# Patient Record
Sex: Female | Born: 1985 | Race: Black or African American | Hispanic: No | Marital: Married | Smoking: Never smoker
Health system: Southern US, Community
[De-identification: ages and names within clinical notes are randomized; demographics above are authoritative.]

## PROBLEM LIST (undated history)

## (undated) DIAGNOSIS — E669 Obesity, unspecified: Secondary | ICD-10-CM

## (undated) HISTORY — DX: Obesity, unspecified: E66.9

## (undated) HISTORY — PX: OTHER SURGICAL HISTORY: SHX169

---

## 2004-07-03 ENCOUNTER — Other Ambulatory Visit: Admission: RE | Admit: 2004-07-03 | Discharge: 2004-07-03 | Payer: Self-pay | Admitting: Obstetrics and Gynecology

## 2004-10-22 ENCOUNTER — Emergency Department (HOSPITAL_COMMUNITY): Admission: EM | Admit: 2004-10-22 | Discharge: 2004-10-22 | Payer: Self-pay | Admitting: Emergency Medicine

## 2005-02-16 ENCOUNTER — Inpatient Hospital Stay (HOSPITAL_COMMUNITY): Admission: AD | Admit: 2005-02-16 | Discharge: 2005-02-16 | Payer: Self-pay | Admitting: *Deleted

## 2006-04-06 ENCOUNTER — Other Ambulatory Visit: Admission: RE | Admit: 2006-04-06 | Discharge: 2006-04-06 | Payer: Self-pay | Admitting: Obstetrics and Gynecology

## 2007-03-11 ENCOUNTER — Other Ambulatory Visit: Admission: RE | Admit: 2007-03-11 | Discharge: 2007-03-11 | Payer: Self-pay | Admitting: Obstetrics and Gynecology

## 2007-05-13 ENCOUNTER — Inpatient Hospital Stay (HOSPITAL_COMMUNITY): Admission: AD | Admit: 2007-05-13 | Discharge: 2007-05-14 | Payer: Self-pay | Admitting: Obstetrics and Gynecology

## 2007-08-18 ENCOUNTER — Ambulatory Visit (HOSPITAL_COMMUNITY): Admission: RE | Admit: 2007-08-18 | Discharge: 2007-08-18 | Payer: Self-pay | Admitting: Obstetrics and Gynecology

## 2007-08-26 ENCOUNTER — Ambulatory Visit (HOSPITAL_COMMUNITY): Admission: RE | Admit: 2007-08-26 | Discharge: 2007-08-26 | Payer: Self-pay | Admitting: Obstetrics and Gynecology

## 2008-01-11 ENCOUNTER — Encounter (INDEPENDENT_AMBULATORY_CARE_PROVIDER_SITE_OTHER): Payer: Self-pay | Admitting: Obstetrics and Gynecology

## 2008-01-11 ENCOUNTER — Inpatient Hospital Stay (HOSPITAL_COMMUNITY): Admission: AD | Admit: 2008-01-11 | Discharge: 2008-01-13 | Payer: Self-pay | Admitting: Obstetrics & Gynecology

## 2009-04-10 ENCOUNTER — Encounter (INDEPENDENT_AMBULATORY_CARE_PROVIDER_SITE_OTHER): Payer: Self-pay | Admitting: Obstetrics and Gynecology

## 2009-04-10 ENCOUNTER — Inpatient Hospital Stay (HOSPITAL_COMMUNITY): Admission: AD | Admit: 2009-04-10 | Discharge: 2009-04-13 | Payer: Self-pay | Admitting: Obstetrics and Gynecology

## 2011-03-25 LAB — CBC
HCT: 30.8 % — ABNORMAL LOW (ref 36.0–46.0)
HCT: 37.4 % (ref 36.0–46.0)
Hemoglobin: 10.3 g/dL — ABNORMAL LOW (ref 12.0–15.0)
MCHC: 33.6 g/dL (ref 30.0–36.0)
MCV: 84.3 fL (ref 78.0–100.0)
RBC: 3.61 MIL/uL — ABNORMAL LOW (ref 3.87–5.11)
RBC: 4.43 MIL/uL (ref 3.87–5.11)
RDW: 15.1 % (ref 11.5–15.5)
WBC: 7.9 10*3/uL (ref 4.0–10.5)

## 2011-03-25 LAB — RPR: RPR Ser Ql: NONREACTIVE

## 2011-04-28 NOTE — Discharge Summary (Signed)
[name], Frances Murray             ACCOUNT NO.:  [id number]   MEDICAL RECORD NO.:  [id number]          PATIENT TYPE:  INP   LOCATION:  9132                          FACILITY:  [facility]   PHYSICIAN:  Gerrit Friends. Aldona Bar, M.D.   DATE OF BIRTH:  27-Oct-1986   DATE OF ADMISSION:  04/10/2009  DATE OF DISCHARGE:                               DISCHARGE SUMMARY   DISCHARGE DIAGNOSES:  1. Term pregnancy delivered 7 pounds 7 ounces female infant, Apgars 7      and 9.  2. Blood type O+.  3. Failure to progress in labor.  4. Meconium-stained fluid.  5. Occiput posterior presentation.   PROCEDURES:  Primary low-transverse cesarean section.   SUMMARY:  This 25 year old gravida 2, para 1 was admitted at almost 41  weeks in labor.  She progressed to about 8 cm of dilatation, but  arrested and required a cesarean section for delivery and was found to  have an occiput posterior presentation.  She was delivered by Dr. Edward Jolly  with 7 pounds 7 ounces female infant with Apgars of 7 and 9.  Her  postpartum/postoperative course was benign.  Her discharge hemoglobin  was 10.3 with a white count of 10,300and platelet count of 141,000.  On  the morning of May 1, she was ambulating well, tolerating a regular diet  well, having normal bowel and bladder function was afebrile.  The wound  was clean and dry and she was anticipating discharge.  Accordingly, she  was given all appropriate instructions for discharge brochure and  understood all instructions well.   Discharge medications include vitamins - one a day as long she is breast-  feeding, Feosol capsules - one four times a week, and she was given  prescriptions for Motrin 600 mg to use every 6 hours as needed for  cramping or pain, and Tylox 1-2 every 4-6 hours as needed for more  severe pain.  She will return to the office in followup in approximately  4 weeks' time or as needed.  Staples were removed and wound was Steri-  Stripped on the morning of discharge.   CONDITION ON DISCHARGE:  Improved.      Gerrit Friends. Aldona Bar, M.D.  Electronically Signed     RMW/MEDQ  D:  04/13/2009  T:  04/13/2009  Job:  [id number]

## 2011-04-28 NOTE — Op Note (Signed)
NAMESCARLETT, PORTLOCK             ACCOUNT NO.:  [id number]   MEDICAL RECORD NO.:  [id number]          PATIENT TYPE:  INP   LOCATION:  9132                          FACILITY:  [facility]   PHYSICIAN:  Randye Lobo, M.D.   DATE OF BIRTH:  10/05/1986   DATE OF PROCEDURE:  04/10/2009  DATE OF DISCHARGE:                               OPERATIVE REPORT   PREOPERATIVE DIAGNOSES:  1. Intrauterine gestation at 40 plus 6 weeks.  2. Failure to progress.   POSTOPERATIVE DIAGNOSES:  1. Intrauterine gestation at 40 plus 6 weeks.  2. Failure to progress.  3. Occiput posterior position.  4. Thick meconium-stained amniotic fluid.   PROCEDURE:  Primary low segment transverse cesarean section.   SURGEON:  Randye Lobo, MD   ANESTHESIA:  Epidural.   INTRAVENOUS FLUIDS:  1900 mL of Ringer's lactate.   ESTIMATED BLOOD LOSS:  900 mL.   URINE OUTPUT:  250 mL.   COMPLICATIONS:  None.   INDICATIONS FOR PROCEDURE:  The patient is a 25 year old gravida 2, para  1-0-0-1 African American female at 74 plus 6 weeks' gestation who  presented early in the morning of April 10, 2009, reporting  contractions.  Upon admission, the patient's cervix was 6 cm dilated.  The patient had been scheduled for a postdates injection later that  evening.  The patient did receive an epidural for anesthesia.  She had  artificial rupture of membranes when she was 8 cm dilated with a  presence of what appeared to be meconium, although it was mixed with  bloody show.  The patient required Pitocin augmentation of her labor.  Her cervix, however, never advanced beyond 8 cm of dilation.  The  Pitocin had to be discontinued on various occasions due to fetal  decelerations.  Overall, the tracing looked reactive and reassuring when  Pitocin was discontinued.  The vertex was suspected to be occiput  posterior and a plan was made to proceed with a cesarean section after  risks, benefits, and alternatives were reviewed.   FINDINGS:   A viable female was delivered at 1432 in the occiput  posterior position with Apgars of 7 at 1 and 9 at 5 minutes.  There was  a nuchal cord x1.  The weight was 7 pounds 7 ounces.  There was thick  meconium-stained amniotic fluid.  The placenta had a evidence of  meconium staining.  The fallopian tubes, uterus, and ovaries appeared to  be normal.   SPECIMEN:  The placenta was sent to pathology.   PROCEDURE:  The patient was escorted from her Labor and Delivery Suite  down to the operating room.  She did receive Ancef 1 g IV for antibiotic  prophylaxis.   In the operating room, her epidural was dosed for surgical anesthesia.  The patient was then placed in the supine position with a left lateral  tilt.  A Foley catheter had been previously placed.  The abdomen was  sterilely prepped and draped.   A Pfannenstiel incision was created sharply with a scalpel.  The  incision was carried down to the fascia using the scalpel  and monopolar  cautery for hemostasis.  The fascia was then incised in the midline and  the incision was extended bilaterally.  The rectus muscles were  dissected off the fascia superiorly and inferiorly.  The rectus muscles  were then sharply divided in the midline.  The parietal perineum was  elevated with the hemostat clamps and entered sharply.  The peritoneal  incision was extended cranially and caudally.   The lower uterine segment was exposed with the bladder retractor.  The  bladder flap was sharply created.  A transverse lower uterine segment  incision was then created sharply with a scalpel.  The incision was  extended bilaterally in an upward fashion with a bandage scissors.  The  hand was inserted through the uterine incision.  There was immediately  cord visible anterior to the vertex.  The vertex was delivered without  difficulty and the nuchal cord was then reduced.  The remainder of the  infant was delivered without difficulty.  The nares and the mouth  were  suctioned with the DeLee upon delivery of the vertex.  The newborn did  have a spontaneous cry and was also then suctioned with a bulb suction.   The newborn was carried over to the awaiting pediatricians in vigorous  condition.   The placenta was then manually extracted and was sent to pathology.   The bladder retractor was then replaced inside the peritoneal cavity.  The uterus was wiped clean with a moistened lap pad.  The uterine  incision was closed with double layer closure of #1 chromic.  The first  was a running locked layer and the second was an imbricating layer.  There was some additional bleeding along the left apical portion of the  incision and a hand was placed behind the broad ligament and figure-of-  eight sutures of #1 chromic were then placed in the apical portion of  the incision to create good hemostasis.  This was successful.   The peritoneal cavity was then irrigated and suctioned and good  hemostasis was confirmed and the abdomen was therefore closed.  The  parietal peritoneum was closed with a running suture of 3-0 Vicryl.  The  rectus muscles were reapproximated in the midline with interrupted  sutures of #1 chromic.  The fascia was closed with a running suture of 0  Vicryl.  The subcutaneous layer was irrigated, suctioned, and made  hemostatic with monopolar cautery.  The skin was closed with staples and  a sterile bandage was placed over this.   This concluded the patient's procedure.  There were no complications.  All needle, instrument, and sponge counts were correct.      Randye Lobo, M.D.  Electronically Signed     BES/MEDQ  D:  04/10/2009  T:  04/11/2009  Job:  [id number]

## 2011-07-28 ENCOUNTER — Other Ambulatory Visit: Payer: Self-pay | Admitting: Obstetrics and Gynecology

## 2011-09-03 LAB — CBC
HCT: 29.7 — ABNORMAL LOW
Hemoglobin: 10.2 — ABNORMAL LOW
MCV: 82.5
Platelets: 167
RBC: 3.59 — ABNORMAL LOW
RBC: 4.4
RDW: 14.8
WBC: 9.4

## 2011-09-03 LAB — WET PREP, GENITAL
Trich, Wet Prep: NONE SEEN
Yeast Wet Prep HPF POC: NONE SEEN

## 2011-09-03 LAB — RPR: RPR Ser Ql: NONREACTIVE

## 2012-10-08 ENCOUNTER — Emergency Department (HOSPITAL_COMMUNITY)
Admission: EM | Admit: 2012-10-08 | Discharge: 2012-10-09 | Disposition: A | Discharge status: Home / Self Care | Payer: BC Managed Care – PPO | Attending: Emergency Medicine | Admitting: Emergency Medicine

## 2012-10-08 ENCOUNTER — Encounter (HOSPITAL_COMMUNITY): Payer: Self-pay | Admitting: *Deleted

## 2012-10-08 DIAGNOSIS — Z792 Long term (current) use of antibiotics: Secondary | ICD-10-CM | POA: Insufficient documentation

## 2012-10-08 DIAGNOSIS — M545 Low back pain, unspecified: Secondary | ICD-10-CM | POA: Insufficient documentation

## 2012-10-08 DIAGNOSIS — N12 Tubulo-interstitial nephritis, not specified as acute or chronic: Secondary | ICD-10-CM

## 2012-10-08 DIAGNOSIS — Z79899 Other long term (current) drug therapy: Secondary | ICD-10-CM | POA: Insufficient documentation

## 2012-10-08 DIAGNOSIS — R42 Dizziness and giddiness: Secondary | ICD-10-CM | POA: Insufficient documentation

## 2012-10-08 LAB — COMPREHENSIVE METABOLIC PANEL WITH GFR
ALT: 13 U/L (ref 0–35)
AST: 17 U/L (ref 0–37)
CO2: 25 meq/L (ref 19–32)
Calcium: 9.5 mg/dL (ref 8.4–10.5)
Chloride: 98 meq/L (ref 96–112)
GFR calc non Af Amer: >90 mL/min (ref >90)
Sodium: 134 meq/L — ABNORMAL LOW (ref 135–145)
Total Bilirubin: 0.5 mg/dL (ref 0.3–1.2)

## 2012-10-08 LAB — CBC WITH DIFFERENTIAL/PLATELET
Basophils Absolute: 0 K/uL (ref 0.0–0.1)
Basophils Relative: 0 % (ref 0–1)
Eosinophils Absolute: 0 10*3/uL (ref 0.0–0.7)
Eosinophils Relative: 0 % (ref 0–5)
HCT: 37.9 % (ref 36.0–46.0)
Hemoglobin: 12.7 g/dL (ref 12.0–15.0)
Lymphocytes Relative: 14 % (ref 12–46)
Lymphs Abs: 1.3 10*3/uL (ref 0.7–4.0)
MCH: 28.9 pg (ref 26.0–34.0)
MCHC: 33.5 g/dL (ref 30.0–36.0)
MCV: 86.3 fL (ref 78.0–100.0)
Monocytes Absolute: 0.6 10*3/uL (ref 0.1–1.0)
Monocytes Relative: 6 % (ref 3–12)
Neutro Abs: 7.5 K/uL (ref 1.7–7.7)
Neutrophils Relative %: 80 % — ABNORMAL HIGH (ref 43–77)
Platelets: 231 K/uL (ref 150–400)
RBC: 4.39 MIL/uL (ref 3.87–5.11)
RDW: 12.6 % (ref 11.5–15.5)
WBC: 9.5 K/uL (ref 4.0–10.5)

## 2012-10-08 LAB — URINALYSIS, ROUTINE W REFLEX MICROSCOPIC
Bilirubin Urine: NEGATIVE
Glucose, UA: NEGATIVE mg/dL
Ketones, ur: NEGATIVE mg/dL
Leukocytes, UA: NEGATIVE
Nitrite: POSITIVE — AB
Protein, ur: NEGATIVE mg/dL
Specific Gravity, Urine: 1.017 (ref 1.005–1.030)
Urobilinogen, UA: 0.2 mg/dL (ref 0.0–1.0)
pH: 6 (ref 5.0–8.0)

## 2012-10-08 LAB — COMPREHENSIVE METABOLIC PANEL
Albumin: 4 g/dL (ref 3.5–5.2)
Alkaline Phosphatase: 84 U/L (ref 39–117)
BUN: 6 mg/dL (ref 6–23)
Creatinine, Ser: 0.8 mg/dL (ref 0.50–1.10)
GFR calc Af Amer: >90 mL/min (ref >90)
Glucose, Bld: 87 mg/dL (ref 70–99)
Potassium: 3.2 mEq/L — ABNORMAL LOW (ref 3.5–5.1)
Total Protein: 8.4 g/dL — ABNORMAL HIGH (ref 6.0–8.3)

## 2012-10-08 LAB — URINE MICROSCOPIC-ADD ON

## 2012-10-08 LAB — POCT PREGNANCY, URINE: Preg Test, Ur: NEGATIVE

## 2012-10-08 MED ORDER — KETOROLAC TROMETHAMINE 30 MG/ML IJ SOLN
30.0000 mg | Freq: Once | INTRAMUSCULAR | Status: AC
  Administered 2012-10-08: 30 mg via INTRAVENOUS
  Filled 2012-10-08: qty 1

## 2012-10-08 MED ORDER — SODIUM CHLORIDE 0.9 % IV BOLUS (SEPSIS)
1000.0000 mL | Freq: Once | INTRAVENOUS | Status: AC
  Administered 2012-10-08: 1000 mL via INTRAVENOUS

## 2012-10-08 MED ORDER — MORPHINE SULFATE 4 MG/ML IJ SOLN
4.0000 mg | Freq: Once | INTRAMUSCULAR | Status: AC
  Administered 2012-10-08: 4 mg via INTRAVENOUS
  Filled 2012-10-08: qty 1

## 2012-10-08 MED ORDER — DEXTROSE 5 % IV SOLN
1.0000 g | Freq: Once | INTRAVENOUS | Status: AC
  Administered 2012-10-08: 1 g via INTRAVENOUS
  Filled 2012-10-08: qty 10

## 2012-10-08 MED ORDER — ACETAMINOPHEN 325 MG PO TABS
650.0000 mg | ORAL_TABLET | Freq: Once | ORAL | Status: AC
  Administered 2012-10-08: 650 mg via ORAL
  Filled 2012-10-08: qty 2

## 2012-10-08 NOTE — ED Notes (Signed)
Pt c/o bil flank pain since yesterday and fever and chills today.  Denies pelvic, abd pain, but states some decreased appetite.

## 2012-10-08 NOTE — ED Provider Notes (Signed)
History     CSN: [id number]  Arrival date & time 10/08/12  1859   First MD Initiated Contact with Patient 10/08/12 2305      Chief Complaint  Patient presents with  . Flank Pain  . Chills  . Dizziness    (Consider location/radiation/quality/duration/timing/severity/associated sxs/prior treatment) HPI Comments: Pt with no sig PMH reports yesterday had some intermittent left flank pain, today has gotten worse, more constant, now fevers, chills, associated with increased freq, no dysuria.  No sig abd pain.  Myalgias and now diffuse lower back pain.  No unusual GYN symptoms, LMP was normal.  Pt denies any prior surgeries.  Pt did not take anything in particular for symptoms.  Pt reports feeling generalized weak and lightheaded at times.  No CP, SOB, cough, congestion.    Patient is a 26 y.o. female presenting with flank pain. The history is provided by the patient and a relative.  Flank Pain Pertinent negatives include no abdominal pain.    History reviewed. No pertinent past medical history.  Past Surgical History  Procedure Date  . Cesarian     History reviewed. No pertinent family history.  History  Substance Use Topics  . Smoking status: Not on file  . Smokeless tobacco: Not on file  . Alcohol Use:     OB History    Grav Para Term Preterm Abortions TAB SAB Ect Mult Living                  Review of Systems  Constitutional: Positive for fever, chills and appetite change.  Gastrointestinal: Negative for nausea, vomiting, abdominal pain, diarrhea and constipation.  Genitourinary: Positive for frequency and flank pain. Negative for decreased urine volume, vaginal bleeding, vaginal discharge, vaginal pain and pelvic pain.  Musculoskeletal: Positive for back pain.  Skin: Negative for rash.  All other systems reviewed and are negative.    Allergies  Review of patient's allergies indicates no known allergies.  Home Medications   Current Outpatient Rx  Name Route  Sig Dispense Refill  . HYDROCODONE-ACETAMINOPHEN 5-325 MG PO TABS  1-2 tablets po q 6 hours prn moderate to severe pain 20 tablet 0  . LEVOFLOXACIN 500 MG PO TABS Oral Take 1 tablet (500 mg total) by mouth daily. 10 tablet 0  . ONDANSETRON 8 MG PO TBDP Oral Take 1 tablet (8 mg total) by mouth every 12 (twelve) hours as needed for nausea. 20 tablet 0    BP 118/58  Pulse 104  Temp 99.8 F (37.7 C) (Oral)  Resp 18  SpO2 97%  LMP 09/13/2012  Physical Exam  Nursing note and vitals reviewed. Constitutional: She is oriented to person, place, and time. She appears well-developed and well-nourished. No distress.       Pt with current rigors and chills  HENT:  Head: Normocephalic and atraumatic.  Eyes: Pupils are equal, round, and reactive to light. No scleral icterus.  Neck: Normal range of motion. Neck supple.  Cardiovascular: Normal rate.   No murmur heard. Pulmonary/Chest: Effort normal and breath sounds normal. No respiratory distress.  Abdominal: Soft. She exhibits no distension. There is tenderness. There is no rebound and no guarding.       Minimal left mid abd pain on palpation  Neurological: She is alert and oriented to person, place, and time.  Skin: Skin is warm and dry. She is not diaphoretic.  Psychiatric: She has a normal mood and affect.    ED Course  Procedures (including critical care time)  Labs Reviewed  CBC WITH DIFFERENTIAL - Abnormal; Notable for the following:    Neutrophils Relative 80 (*)     All other components within normal limits  COMPREHENSIVE METABOLIC PANEL - Abnormal; Notable for the following:    Sodium 134 (*)     Potassium 3.2 (*)     Total Protein 8.4 (*)     All other components within normal limits  URINALYSIS, ROUTINE W REFLEX MICROSCOPIC - Abnormal; Notable for the following:    APPearance CLOUDY (*)     Hgb urine dipstick SMALL (*)     Nitrite POSITIVE (*)     All other components within normal limits  URINE MICROSCOPIC-ADD ON -  Abnormal; Notable for the following:    Squamous Epithelial / LPF FEW (*)     Bacteria, UA MANY (*)     All other components within normal limits  POCT PREGNANCY, URINE  URINE CULTURE   No results found.   1. Pyelonephritis      ra sat is 100% and i interpret to be normal   1:11 AM Fever again improved, HR is down to near normal with toradol, pain control and IVF bolus.  Will give Rx for pain, nausea and levaquin for 10 days MDM  Symptoms suggestive of pylenoephritis.  No GYN symptoms.  Pt has GYN she can follow up with.  Will give dose of IV abx, culture sent.  + nitrites, bacteria with few squamous epithelials.  WBC is normal, but with left shift.  Discussed symptoms, return instructions, importance of follow up with PCP and expectations.  No N/V.  Will give more antipyretics, IVF's and ensure HR improves as well.  BP normal.          Gavin Pound. Oletta Lamas, MD 10/09/12 1610

## 2012-10-08 NOTE — ED Provider Notes (Signed)
MSE was initiated and I personally evaluated the patient and placed orders (if any) at  11:04 PM on October 08, 2012.  The patient appears stable so that the remainder of the MSE may be completed by another provider.  Ethelda Chick, MD 10/08/12 [phone]

## 2012-10-08 NOTE — ED Notes (Signed)
I gave the patient a warm blanket. 

## 2012-10-09 MED ORDER — LEVOFLOXACIN 500 MG PO TABS: 500.0000 mg | ORAL_TABLET | Freq: Every day | ORAL | Status: DC

## 2012-10-09 MED ORDER — HYDROCODONE-ACETAMINOPHEN 5-325 MG PO TABS: ORAL_TABLET | ORAL | Status: DC

## 2012-10-09 MED ORDER — ONDANSETRON 8 MG PO TBDP: 8.0000 mg | ORAL_TABLET | Freq: Two times a day (BID) | ORAL | Status: DC | PRN

## 2012-10-09 NOTE — Discharge Instructions (Signed)
 Pyelonephritis, Adult Pyelonephritis is a kidney infection. In general, there are 2 main types of pyelonephritis:  Infections that come on quickly without any warning (acute pyelonephritis).  Infections that persist for a long period of time (chronic pyelonephritis). CAUSES  Two main causes of pyelonephritis are:  Bacteria traveling from the bladder to the kidney. This is a problem especially in pregnant women. The urine in the bladder can become filled with bacteria from multiple causes, including:  Inflammation of the prostate gland (prostatitis).  Sexual intercourse in females.  Bladder infection (cystitis).  Bacteria traveling from the bloodstream to the tissue part of the kidney. Problems that may increase your risk of getting a kidney infection include:  Diabetes.  Kidney stones or bladder stones.  Cancer.  Catheters placed in the bladder.  Other abnormalities of the kidney or ureter. SYMPTOMS   Abdominal pain.  Pain in the side or flank area.  Fever.  Chills.  Upset stomach.  Blood in the urine (dark urine).  Frequent urination.  Strong or persistent urge to urinate.  Burning or stinging when urinating. DIAGNOSIS  Your caregiver may diagnose your kidney infection based on your symptoms. A urine sample may also be taken. TREATMENT  In general, treatment depends on how severe the infection is.   If the infection is mild and caught early, your caregiver may treat you with oral antibiotics and send you home.  If the infection is more severe, the bacteria may have gotten into the bloodstream. This will require intravenous (IV) antibiotics and a hospital stay. Symptoms may include:  High fever.  Severe flank pain.  Shaking chills.  Even after a hospital stay, your caregiver may require you to be on oral antibiotics for a period of time.  Other treatments may be required depending upon the cause of the infection. HOME CARE INSTRUCTIONS   Take your  antibiotics as directed. Finish them even if you start to feel better.  Make an appointment to have your urine checked to make sure the infection is gone.  Drink enough fluids to keep your urine clear or pale yellow.  Take medicines for the bladder if you have urgency and frequency of urination as directed by your caregiver. SEEK IMMEDIATE MEDICAL CARE IF:   You have a fever or persistent symptoms for more than 2-3 days.  You have a fever and your symptoms suddenly get worse.  You are unable to take your antibiotics or fluids.  You develop shaking chills.  You experience extreme weakness or fainting.  There is no improvement after 2 days of treatment. MAKE SURE YOU:  Understand these instructions.  Will watch your condition.  Will get help right away if you are not doing well or get worse. Document Released: 11/30/2005 Document Revised: 05/31/2012 Document Reviewed: 05/06/2011 [facility] Patient Information 2013 [place], [place].    Narcotic and benzodiazepine use may cause drowsiness, slowed breathing or dependence.  Please use with caution and do not drive, operate machinery or watch young children alone while taking them.  Taking combinations of these medications or drinking alcohol will potentiate these effects.     Take ibuprofen  for aches and fevers.  Norco contains tylenol  already.  Take antibiotics to completion.

## 2012-10-09 NOTE — ED Notes (Signed)
I gave the patient a cup of ice and a sprite. I gave her visitor the same.

## 2012-10-10 LAB — URINE CULTURE

## 2012-10-11 NOTE — ED Notes (Signed)
+   Urine Patient treated with Levaquin to same-chart appended per protocol MD.

## 2013-06-15 ENCOUNTER — Other Ambulatory Visit: Payer: Self-pay | Admitting: Obstetrics and Gynecology

## 2014-05-03 ENCOUNTER — Other Ambulatory Visit: Payer: Self-pay | Admitting: Family Medicine

## 2014-05-03 DIAGNOSIS — E010 Iodine-deficiency related diffuse (endemic) goiter: Secondary | ICD-10-CM

## 2014-05-08 ENCOUNTER — Ambulatory Visit
Admission: RE | Admit: 2014-05-08 | Discharge: 2014-05-08 | Disposition: A | Discharge status: Home / Self Care | Payer: BC Managed Care – PPO | Source: Ambulatory Visit | Attending: Family Medicine | Admitting: Family Medicine

## 2014-05-08 DIAGNOSIS — E010 Iodine-deficiency related diffuse (endemic) goiter: Secondary | ICD-10-CM

## 2014-06-21 ENCOUNTER — Encounter: Payer: Self-pay | Admitting: Dietician

## 2014-06-21 ENCOUNTER — Encounter: Payer: BC Managed Care – PPO | Attending: Family Medicine | Admitting: Dietician

## 2014-06-21 DIAGNOSIS — Z713 Dietary counseling and surveillance: Secondary | ICD-10-CM | POA: Diagnosis not present

## 2014-06-21 NOTE — Patient Instructions (Addendum)
-  Make a grocery list based on planned meals  -Avoid going to the store hungry   -Fill up on non-starchy vegetables (any veggie except corn, peas, and potatoes)  -Follow MyPlate recommendations  -Increase activity level  -Find a new exercise video on YouTube (goal: 2-3x a week)  -Consider getting a pedometer  -Zumba?  -Eat with the family at the table  -Practice eating mindfully and listen to hunger cues  -Avoid skipping meals  -Refer to snack list  -Have something every 4-5 hours   -Only have cobbler on peach day  -Avoid sweetened beverages  -Have Splenda in coffee  -Cut sweet tea with 1/2 unsweetened tea  -Think of strategies to manage stress that don't involve eating  -Exercise/stretching?

## 2014-06-21 NOTE — Progress Notes (Signed)
  Medical Nutrition Therapy:  Appt start time: 1000 end time:  1045.   Assessment:  Primary concerns today: Frances Murray is here today to discuss weight loss strategies. 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 Kanai lives with her husband and 2 young children. She states that she has always been overweight. Lowest weight: 160 lbs in high school. She and her husband share the grocery shopping responsibilities and she usually cooks. Eddy usually eats lunch in the cafeteria at work. The family goes out to eat on the weekends sometimes. At home, she eats in her bedroom with the TV on. She reports that she has a sweet tooth and struggles with cravings. She also states that she is a stress/emotional eater.  Preferred Learning Style:  No preference indicated   Learning readiness:  Contemplating  Ready   MEDICATIONS: multivitamin   DIETARY INTAKE:  Avoided foods include red meat.    24-hr recall:  B ( AM): sometimes skips; grits, toast, bacon, cheese at work OR pancakes, [place] sausage, eggs Snk ( AM): none  L ( PM): chicken, [place], or fish with vegetable, and dessert  Snk ( PM): none D ( PM): sometimes skips; more like a snack like chips and salsa Snk ( PM): sometimes; chips and dip, grapes  Beverages: mostly water, coffee with cream and sugar or Splenda, sweet tea, having less fruit juice  Usual physical activity: none; has a gym membership  Estimated energy needs: 2000 calories  Progress Towards Goal(s):  In progress.   Nutritional Diagnosis:  [place]-3.3 Overweight/obesity As related to excess energy intake, inappropriate food choices, and physical inactivity.  As evidenced by BMI 41.5.    Intervention:  Nutrition counseling provided.  -Make a grocery list based on planned meals  -Avoid going to the store hungry   -Fill up on non-starchy vegetables (any veggie except corn, peas, and potatoes)  -Follow MyPlate recommendations  -Increase activity  level  -Find a new exercise video on YouTube (goal: 2-3x a week)  -Consider getting a pedometer  -Zumba?  -Eat with the family at the table  -Practice eating mindfully and listen to hunger cues  -Avoid skipping meals  -Refer to snack list  -Have something every 4-5 hours   -Only have cobbler on peach day  -Avoid sweetened beverages  -Have Splenda in coffee  -Cut sweet tea with 1/2 unsweetened tea  -Think of strategies to manage stress that don't involve eating  -Exercise/stretching?  Teaching Method Utilized:  Visual Auditory  Handouts given during visit include:  MyPlate  15g CHO + protein snacks  Barriers to learning/adherence to lifestyle change: schedule  Demonstrated degree of understanding via:  Teach Back   Monitoring/Evaluation:  Dietary intake, exercise, and body weight in 2 month(s).

## 2014-08-23 ENCOUNTER — Ambulatory Visit: Payer: BC Managed Care – PPO | Admitting: Dietician

## 2014-10-25 ENCOUNTER — Other Ambulatory Visit: Payer: Self-pay | Admitting: Obstetrics and Gynecology

## 2014-10-26 LAB — CYTOLOGY - PAP

## 2017-12-15 ENCOUNTER — Emergency Department (HOSPITAL_COMMUNITY)
Admission: EM | Admit: 2017-12-15 | Discharge: 2017-12-15 | Disposition: A | Discharge status: Home / Self Care | Payer: BLUE CROSS/BLUE SHIELD | Attending: Emergency Medicine | Admitting: Emergency Medicine

## 2017-12-15 ENCOUNTER — Emergency Department (HOSPITAL_COMMUNITY): Payer: BLUE CROSS/BLUE SHIELD

## 2017-12-15 ENCOUNTER — Other Ambulatory Visit: Payer: Self-pay

## 2017-12-15 ENCOUNTER — Encounter (HOSPITAL_COMMUNITY): Payer: Self-pay

## 2017-12-15 DIAGNOSIS — R102 Pelvic and perineal pain: Secondary | ICD-10-CM | POA: Insufficient documentation

## 2017-12-15 LAB — URINALYSIS, ROUTINE W REFLEX MICROSCOPIC
BILIRUBIN URINE: NEGATIVE
Glucose, UA: NEGATIVE mg/dL
HGB URINE DIPSTICK: NEGATIVE
Ketones, ur: NEGATIVE mg/dL
Leukocytes, UA: NEGATIVE
Nitrite: NEGATIVE
PROTEIN: NEGATIVE mg/dL
Specific Gravity, Urine: 1.02 (ref 1.005–1.030)
pH: 6 (ref 5.0–8.0)

## 2017-12-15 LAB — PREGNANCY, URINE: Preg Test, Ur: NEGATIVE

## 2017-12-15 LAB — WET PREP, GENITAL
Clue Cells Wet Prep HPF POC: NONE SEEN
Sperm: NONE SEEN
Trich, Wet Prep: NONE SEEN
YEAST WET PREP: NONE SEEN

## 2017-12-15 NOTE — Discharge Instructions (Signed)
It was my pleasure taking care of you today!   Fortunately your imaging was reassuring today.   Please follow up with a primary care doctor or OBGYN.   Return to ER for new or worsening symptoms, any additional concerns.

## 2017-12-15 NOTE — ED Notes (Signed)
Pt departed in NAD, refused use of wheelchair.  

## 2017-12-15 NOTE — ED Triage Notes (Signed)
Pt reports pelvic pain and abdominal cramping x 1 week. Denies vaginal bleeding or discharge. PT reports having IUD. Denies fevers/chills.

## 2017-12-15 NOTE — ED Notes (Addendum)
Pt states lower pelvic pain x   Little over a week, started on left now more on rt states has an IUD and not had a period for a while, states has had IUD x 3 years  Has not seen her GYN dr  For 2 years  States she thought she had a UTI a week ago and saw a dr but it was clean she states

## 2017-12-15 NOTE — ED Notes (Signed)
Pt back from [facility]

## 2017-12-15 NOTE — ED Provider Notes (Signed)
[facility] EMERGENCY DEPARTMENT Provider Note   CSN: [id number] Arrival date & time: 12/15/17  1029     History   Chief Complaint Chief Complaint  Patient presents with  . Pelvic Pain    HPI Frances Murray is a 32 y.o. female.  The history is provided by the patient and medical records. No language interpreter was used.  Pelvic Pain    Frances Murray is a 32 y.o. female  with no pertinent PMH who presents to the Emergency Department complaining of lower pelvic pain x 1 week. Patient states that pain feels similar to menstrual cramping pain, however she has had IUD placed for 3 years now and no longer has periods. She has never had this type of pain before. It occurs daily, but will come and go throughout the day. No fever, chills, dysuria, urinary urgency/frequency, vaginal discharge, back pain, abdominal pain, nausea or vomiting.  She seen in urgent care a few days ago where a urine sample was done and she was told it was negative and to follow-up with a doctor if it did not improve.   Past Medical History:  Diagnosis Date  . Obesity     There are no active problems to display for this patient.   Past Surgical History:  Procedure Laterality Date  . cesarian      OB History    No data available       Home Medications    Prior to Admission medications   Medication Sig Start Date End Date Taking? Authorizing Provider  HYDROcodone-acetaminophen (NORCO/VICODIN) 5-325 MG per tablet 1-2 tablets po q 6 hours prn moderate to severe pain 10/09/12   Quita Skye, MD  levofloxacin (LEVAQUIN) 500 MG tablet Take 1 tablet (500 mg total) by mouth daily. 10/09/12   Quita Skye, MD  ondansetron (ZOFRAN-ODT) 8 MG disintegrating tablet Take 1 tablet (8 mg total) by mouth every 12 (twelve) hours as needed for nausea. 10/09/12   Quita Skye, MD    Family History Family History  Problem Relation Age of Onset  . Obesity Other   . Cancer Other   .  Hyperlipidemia Other   . Hypertension Other   . Heart disease Other     Social History Social History   Tobacco Use  . Smoking status: Never Smoker  . Smokeless tobacco: Never Used  Substance Use Topics  . Alcohol use: No    Frequency: Never  . Drug use: No     Allergies   Patient has no known allergies.   Review of Systems Review of Systems  Genitourinary: Positive for pelvic pain. Negative for decreased urine volume, difficulty urinating, dysuria, frequency, hematuria, urgency, vaginal bleeding and vaginal discharge.  All other systems reviewed and are negative.    Physical Exam Updated Vital Signs BP 131/79 (BP Location: Right Arm)   Pulse 78   Temp 98.7 F (37.1 C) (Oral)   Resp 18   Ht 5\' 5"  (1.651 m)   Wt 114.8 kg (253 lb)   SpO2 98%   BMI 42.10 kg/m   Physical Exam  Constitutional: She is oriented to person, place, and time. She appears well-developed and well-nourished. No distress.  HENT:  Head: Normocephalic and atraumatic.  Cardiovascular: Normal rate, regular rhythm and normal heart sounds.  No murmur heard. Pulmonary/Chest: Effort normal and breath sounds normal. No respiratory distress.  Abdominal: Soft. She exhibits no distension. There is no tenderness.    Tenderness to palpation to the very  low aspect of the right lower abdomen / groin as depicted in image. No focal tenderness at McBurney's point.   Genitourinary:  Genitourinary Comments: Chaperone present for exam. No discharge. No CMT. No adnexal masses, tenderness, or fullness.  No bleeding within vaginal vault.  Neurological: She is alert and oriented to person, place, and time.  Skin: Skin is warm and dry.  Nursing note and vitals reviewed.    ED Treatments / Results  Labs (all labs ordered are listed, but only abnormal results are displayed) Labs Reviewed  WET PREP, GENITAL - Abnormal; Notable for the following components:      Result Value   WBC, Wet Prep HPF POC FEW (*)     All other components within normal limits  URINALYSIS, ROUTINE W REFLEX MICROSCOPIC  PREGNANCY, URINE  POC URINE PREG, ED  GC/CHLAMYDIA PROBE AMP ([place]) NOT [facility]    EKG  EKG Interpretation None       Radiology [place] Transvaginal Non-ob  Result Date: 12/15/2017 CLINICAL DATA:  Right-sided pelvic pain. EXAM: TRANSABDOMINAL AND TRANSVAGINAL ULTRASOUND OF PELVIS DOPPLER ULTRASOUND OF OVARIES TECHNIQUE: Both transabdominal and transvaginal ultrasound examinations of the pelvis were performed. Transabdominal technique was performed for global imaging of the pelvis including uterus, ovaries, adnexal regions, and pelvic cul-de-sac. It was necessary to proceed with endovaginal exam following the transabdominal exam to visualize the adnexal regions to an adequate degree. Color and duplex Doppler ultrasound was utilized to evaluate blood flow to the ovaries. COMPARISON:  None. FINDINGS: Uterus Measurements: 10 x 4.3 x 5.2 cm. No fibroids or other mass visualized. Endometrium Thickness: Normal at 8 mm. No focal abnormality visualized. IUD appears appropriately positioned. Right ovary Measurements: 3.3 x 2.8 x 1.9 cm. Normal appearance/no adnexal mass. Left ovary Measurements: 3.1 x 2.1 x 2 cm. Normal appearance/no adnexal mass. Pulsed Doppler evaluation of both ovaries demonstrates normal low-resistance arterial and venous waveforms. Other findings No abnormal free fluid. IMPRESSION: Normal pelvic ultrasound. Specifically, bilateral ovaries appear normal with no evidence of torsion. No adnexal mass or free fluid identified. Electronically Signed   By: Bary RichardStan  Maynard M.D.   On: 12/15/2017 18:35   [place] Pelvis Complete  Result Date: 12/15/2017 CLINICAL DATA:  Right-sided pelvic pain. EXAM: TRANSABDOMINAL AND TRANSVAGINAL ULTRASOUND OF PELVIS DOPPLER ULTRASOUND OF OVARIES TECHNIQUE: Both transabdominal and transvaginal ultrasound examinations of the pelvis were performed. Transabdominal technique was  performed for global imaging of the pelvis including uterus, ovaries, adnexal regions, and pelvic cul-de-sac. It was necessary to proceed with endovaginal exam following the transabdominal exam to visualize the adnexal regions to an adequate degree. Color and duplex Doppler ultrasound was utilized to evaluate blood flow to the ovaries. COMPARISON:  None. FINDINGS: Uterus Measurements: 10 x 4.3 x 5.2 cm. No fibroids or other mass visualized. Endometrium Thickness: Normal at 8 mm. No focal abnormality visualized. IUD appears appropriately positioned. Right ovary Measurements: 3.3 x 2.8 x 1.9 cm. Normal appearance/no adnexal mass. Left ovary Measurements: 3.1 x 2.1 x 2 cm. Normal appearance/no adnexal mass. Pulsed Doppler evaluation of both ovaries demonstrates normal low-resistance arterial and venous waveforms. Other findings No abnormal free fluid. IMPRESSION: Normal pelvic ultrasound. Specifically, bilateral ovaries appear normal with no evidence of torsion. No adnexal mass or free fluid identified. Electronically Signed   By: Bary RichardStan  Maynard M.D.   On: 12/15/2017 18:35   [place] Art/ven Flow Abd Pelv Doppler  Result Date: 12/15/2017 CLINICAL DATA:  Right-sided pelvic pain. EXAM: TRANSABDOMINAL AND TRANSVAGINAL ULTRASOUND OF PELVIS DOPPLER ULTRASOUND OF  OVARIES TECHNIQUE: Both transabdominal and transvaginal ultrasound examinations of the pelvis were performed. Transabdominal technique was performed for global imaging of the pelvis including uterus, ovaries, adnexal regions, and pelvic cul-de-sac. It was necessary to proceed with endovaginal exam following the transabdominal exam to visualize the adnexal regions to an adequate degree. Color and duplex Doppler ultrasound was utilized to evaluate blood flow to the ovaries. COMPARISON:  None. FINDINGS: Uterus Measurements: 10 x 4.3 x 5.2 cm. No fibroids or other mass visualized. Endometrium Thickness: Normal at 8 mm. No focal abnormality visualized. IUD appears  appropriately positioned. Right ovary Measurements: 3.3 x 2.8 x 1.9 cm. Normal appearance/no adnexal mass. Left ovary Measurements: 3.1 x 2.1 x 2 cm. Normal appearance/no adnexal mass. Pulsed Doppler evaluation of both ovaries demonstrates normal low-resistance arterial and venous waveforms. Other findings No abnormal free fluid. IMPRESSION: Normal pelvic ultrasound. Specifically, bilateral ovaries appear normal with no evidence of torsion. No adnexal mass or free fluid identified. Electronically Signed   By: Bary Richard M.D.   On: 12/15/2017 18:35    Procedures Procedures (including critical care time)  Medications Ordered in ED Medications - No data to display   Initial Impression / Assessment and Plan / ED Course  I have reviewed the triage vital signs and the nursing notes.  Pertinent labs & imaging results that were available during my care of the patient were reviewed by me and considered in my medical decision making (see chart for details).    Frances Murray is a 32 y.o. female who presents to ED for right sided pelvic pain x 1 week. UA negative. GU exam with no discharge and no cervical or adnexal tenderness. No tenderness at McBurney's and non-surgical abdomen, but she does have tenderness to the very low aspect of the right side, more toward the pelvic region. No fever, chills and she is non-toxic appearing. Doubt appendicitis. Upreg negative. Exam and wet prep not concerning for infectious etiology. Ultrasound obtained which was normal with no signs of ovarian torsion or cysts. Repeat abdominal exam very reassuring. Patient appears well. Evaluation does not show pathology that would require ongoing emergent intervention or inpatient treatment. PCP or OBGYN follow up. Reasons to return to ER discussed and all questions answered.   Final Clinical Impressions(s) / ED Diagnoses   Final diagnoses:  Pelvic pain in female    ED Discharge Orders    None       Felicia Both, Chase Picket, PA-C 12/15/17 1926    Linwood Dibbles, MD 12/16/17 2308

## 2017-12-16 LAB — GC/CHLAMYDIA PROBE AMP (~~LOC~~) NOT AT ARMC
CHLAMYDIA, DNA PROBE: NEGATIVE
NEISSERIA GONORRHEA: NEGATIVE

## 2019-09-08 ENCOUNTER — Other Ambulatory Visit: Payer: Self-pay

## 2019-09-08 ENCOUNTER — Emergency Department (HOSPITAL_COMMUNITY)
Admission: EM | Admit: 2019-09-08 | Discharge: 2019-09-09 | Disposition: A | Discharge status: Home / Self Care | Payer: BLUE CROSS/BLUE SHIELD | Attending: Emergency Medicine | Admitting: Emergency Medicine

## 2019-09-08 ENCOUNTER — Encounter (HOSPITAL_COMMUNITY): Payer: Self-pay | Admitting: Emergency Medicine

## 2019-09-08 DIAGNOSIS — E876 Hypokalemia: Secondary | ICD-10-CM | POA: Insufficient documentation

## 2019-09-08 DIAGNOSIS — T407X1A Poisoning by cannabis (derivatives), accidental (unintentional), initial encounter: Secondary | ICD-10-CM

## 2019-09-08 DIAGNOSIS — T40711A Poisoning by cannabis, accidental (unintentional), initial encounter: Secondary | ICD-10-CM

## 2019-09-08 DIAGNOSIS — R000 Tachycardia, unspecified: Secondary | ICD-10-CM | POA: Diagnosis not present

## 2019-09-08 DIAGNOSIS — F10929 Alcohol use, unspecified with intoxication, unspecified: Secondary | ICD-10-CM | POA: Diagnosis present

## 2019-09-08 MED ORDER — SODIUM CHLORIDE 0.9 % IV BOLUS
1000.0000 mL | Freq: Once | INTRAVENOUS | Status: AC
  Administered 2019-09-09: 1000 mL via INTRAVENOUS

## 2019-09-08 NOTE — ED Triage Notes (Addendum)
Patient was brought by [facility] from a friends house. Patient has not ever used edibles and has a 1/3 of an edible. Patient has ETOH on board. Patient is on a weight loss medication called Qysmia. Patient was vagal down by EMS due to HR-160.

## 2019-09-08 NOTE — ED Notes (Signed)
Pt instructed to do vagal maneuvers in attempt to lower HR.

## 2019-09-08 NOTE — ED Provider Notes (Signed)
[facility]-EMERGENCY DEPT Provider Note   CSN: [id number] Arrival date & time: 09/08/19  2208    History   Chief Complaint Chief Complaint  Patient presents with  . Alcohol Intoxication  . edibles.    HPI Frances Murray is a 33 y.o. female.   The history is provided by the patient.  Alcohol Intoxication  She was at a birthday party where she had a considerable amount of alcohol and also had a marijuana edible.  She then had an episode where her head was shaking from side to side and she had decreased awareness of though she did not have complete loss of consciousness.  She could sense that her heart was racing.  She noted that her mouth was dry and her face felt numb and there is intermittent numbness of her hands and feet.  EMS was called and noted rapid heart rate.  She has been attempting Valsalva maneuvers without any impact on her heart rate.  Of note, she was started on Qsymia about 6 weeks ago.  She denies other drug use.  She has an IUD for contraception, does not know when her last menses was.  Past Medical History:  Diagnosis Date  . Obesity     There are no active problems to display for this patient.   Past Surgical History:  Procedure Laterality Date  . cesarian       OB History   No obstetric history on file.      Home Medications    Prior to Admission medications   Medication Sig Start Date End Date Taking? Authorizing Provider  HYDROcodone-acetaminophen (NORCO/VICODIN) 5-325 MG per tablet 1-2 tablets po q 6 hours prn moderate to severe pain 10/09/12   Quita SkyeGhim, Michael, MD  levofloxacin (LEVAQUIN) 500 MG tablet Take 1 tablet (500 mg total) by mouth daily. 10/09/12   Quita SkyeGhim, Michael, MD  ondansetron (ZOFRAN-ODT) 8 MG disintegrating tablet Take 1 tablet (8 mg total) by mouth every 12 (twelve) hours as needed for nausea. 10/09/12   Quita SkyeGhim, Michael, MD    Family History Family History  Problem Relation Age of Onset  . Obesity Other    . Cancer Other   . Hyperlipidemia Other   . Hypertension Other   . Heart disease Other     Social History Social History   Tobacco Use  . Smoking status: Never Smoker  . Smokeless tobacco: Never Used  Substance Use Topics  . Alcohol use: No    Frequency: Never  . Drug use: No     Allergies   Other   Review of Systems Review of Systems  All other systems reviewed and are negative.    Physical Exam Updated Vital Signs BP (!) 152/96 (BP Location: Right Arm)   Pulse (!) 140   Temp 98.6 F (37 C) (Oral)   Resp 16   Ht 5\' 5"  (1.651 m)   Wt 110.2 kg   SpO2 100%   BMI 40.44 kg/m   Physical Exam Vitals signs and nursing note reviewed.    33 year old female, resting comfortably and in no acute distress. Vital signs are significant for elevated heart rate and blood pressure. Oxygen saturation is 100%, which is normal. Head is normocephalic and atraumatic. PERRLA, EOMI. Oropharynx is clear. Neck is nontender and supple without adenopathy or JVD. Back is nontender and there is no CVA tenderness. Lungs are clear without rales, wheezes, or rhonchi. Chest is nontender. Heart is tachycardic without murmur. Abdomen is soft,  flat, nontender without masses or hepatosplenomegaly and peristalsis is normoactive. Extremities have no cyanosis or edema, full range of motion is present. Skin is warm and dry without rash. Neurologic: Mental status is normal, cranial nerves are intact, there are no motor or sensory deficits.  ED Treatments / Results  Labs (all labs ordered are listed, but only abnormal results are displayed) Labs Reviewed  COMPREHENSIVE METABOLIC PANEL - Abnormal; Notable for the following components:      Result Value   Potassium 3.3 (*)    CO2 18 (*)    Glucose, Bld 142 (*)    Creatinine, Ser 1.09 (*)    All other components within normal limits  ETHANOL - Abnormal; Notable for the following components:   Alcohol, Ethyl (B) 11 (*)    All other components  within normal limits  CBC WITH DIFFERENTIAL/PLATELET - Abnormal; Notable for the following components:   MCHC 29.9 (*)    All other components within normal limits  RAPID URINE DRUG SCREEN, HOSP PERFORMED - Abnormal; Notable for the following components:   Tetrahydrocannabinol POSITIVE (*)    All other components within normal limits  MAGNESIUM  I-STAT BETA HCG BLOOD, ED ([facility], [facility], [facility] ONLY)    EKG EKG Interpretation  Date/Time:  Saturday September 09 2019 00:24:05 EDT Ventricular Rate:  131 PR Interval:    QRS Duration: 63 QT Interval:  298 QTC Calculation: 440 R Axis:   52 Text Interpretation:  Sinus tachycardia Consider right atrial enlargement Borderline T abnormalities, inferior leads No old tracing to compare Confirmed by Delora Fuel ([id number]) on 09/09/2019 12:28:09 AM   Radiology Ct Head Wo Contrast  Result Date: 09/09/2019 CLINICAL DATA:  Altered mental status. Numbness and extremity. EXAM: CT HEAD WITHOUT CONTRAST TECHNIQUE: Contiguous axial images were obtained from the base of the skull through the vertex without intravenous contrast. COMPARISON:  None. FINDINGS: Brain: No evidence of acute infarction, hemorrhage, hydrocephalus, extra-axial collection or mass lesion/mass effect. Vascular: No hyperdense vessel or unexpected calcification. Skull: Normal. Negative for fracture or focal lesion. Sinuses/Orbits: Paranasal sinuses and mastoid air cells are clear. The visualized orbits are unremarkable. Other: None. IMPRESSION: Negative noncontrast head CT. Electronically Signed   By: Keith Rake M.D.   On: 09/09/2019 00:46    Procedures Procedures   Medications Ordered in ED Medications - No data to display   Initial Impression / Assessment and Plan / ED Course  I have reviewed the triage vital signs and the nursing notes.  Pertinent labs & imaging results that were available during my care of the patient were reviewed by me and considered in my medical decision making  (see chart for details).  Episode of head shaking of uncertain cause.  Rapid heart rate which might be from some degree of dehydration and also some impact from amphetamines being taken for weight loss.  She will be given IV fluids.  Will check screening labs.  Will send for CT of head.  Old records are reviewed confirming recent initiation of Qsymia, no other relevant past visits.  ECG shows sinus tachycardia.  CT of head shows no acute process.  ED work-up is significant only for mild hypokalemia and she is given a dose of oral potassium.  Heart rate came down somewhat with IV fluids, but then would go back up with minimal activity.  She is given additional IV fluid as well as a small dose of metoprolol.  Following this, she is feeling better and heart rate is down to 100.  She is felt to be safe for discharge.  Drug screen was positive for marijuana and otherwise negative.  She is advised not to consume edible marijuana in the future.  Return precautions discussed.  Final Clinical Impressions(s) / ED Diagnoses   Final diagnoses:  Accidental marijuana overdose, initial encounter  Sinus tachycardia  Hypokalemia    ED Discharge Orders    None       Dione Booze, MD 09/09/19 0430

## 2019-09-09 ENCOUNTER — Emergency Department (HOSPITAL_COMMUNITY): Payer: BLUE CROSS/BLUE SHIELD

## 2019-09-09 LAB — CBC WITH DIFFERENTIAL/PLATELET
Abs Immature Granulocytes: 0.02 10*3/uL (ref 0.00–0.07)
Basophils Absolute: 0 10*3/uL (ref 0.0–0.1)
Basophils Relative: 1 %
Eosinophils Absolute: 0.1 10*3/uL (ref 0.0–0.5)
Eosinophils Relative: 1 %
HCT: 44.1 % (ref 36.0–46.0)
Hemoglobin: 13.2 g/dL (ref 12.0–15.0)
Immature Granulocytes: 0 %
Lymphocytes Relative: 23 %
Lymphs Abs: 1.5 10*3/uL (ref 0.7–4.0)
MCH: 28 pg (ref 26.0–34.0)
MCHC: 29.9 g/dL — ABNORMAL LOW (ref 30.0–36.0)
MCV: 93.6 fL (ref 80.0–100.0)
Monocytes Absolute: 0.3 10*3/uL (ref 0.1–1.0)
Monocytes Relative: 5 %
Neutro Abs: 4.4 10*3/uL (ref 1.7–7.7)
Neutrophils Relative %: 70 %
Platelets: 255 10*3/uL (ref 150–400)
RBC: 4.71 MIL/uL (ref 3.87–5.11)
RDW: 13.1 % (ref 11.5–15.5)
WBC: 6.3 10*3/uL (ref 4.0–10.5)
nRBC: 0 % (ref 0.0–0.2)

## 2019-09-09 LAB — RAPID URINE DRUG SCREEN, HOSP PERFORMED
Amphetamines: NOT DETECTED
Barbiturates: NOT DETECTED
Benzodiazepines: NOT DETECTED
Cocaine: NOT DETECTED
Opiates: NOT DETECTED
Tetrahydrocannabinol: POSITIVE — AB

## 2019-09-09 LAB — COMPREHENSIVE METABOLIC PANEL
ALT: 15 U/L (ref 0–44)
AST: 18 U/L (ref 15–41)
Albumin: 4.4 g/dL (ref 3.5–5.0)
Alkaline Phosphatase: 69 U/L (ref 38–126)
Anion gap: 10 (ref 5–15)
BUN: 13 mg/dL (ref 6–20)
CO2: 18 mmol/L — ABNORMAL LOW (ref 22–32)
Calcium: 9.2 mg/dL (ref 8.9–10.3)
Chloride: 109 mmol/L (ref 98–111)
Creatinine, Ser: 1.09 mg/dL — ABNORMAL HIGH (ref 0.44–1.00)
GFR calc Af Amer: >60 mL/min (ref >60)
GFR calc non Af Amer: >60 mL/min (ref >60)
Glucose, Bld: 142 mg/dL — ABNORMAL HIGH (ref 70–99)
Potassium: 3.3 mmol/L — ABNORMAL LOW (ref 3.5–5.1)
Sodium: 137 mmol/L (ref 135–145)
Total Bilirubin: 0.3 mg/dL (ref 0.3–1.2)
Total Protein: 8 g/dL (ref 6.5–8.1)

## 2019-09-09 LAB — ETHANOL: Alcohol, Ethyl (B): 11 mg/dL — ABNORMAL HIGH (ref <10)

## 2019-09-09 LAB — I-STAT BETA HCG BLOOD, ED (MC, WL, AP ONLY): I-stat hCG, quantitative: <5 m[IU]/mL (ref <5)

## 2019-09-09 LAB — MAGNESIUM: Magnesium: 1.9 mg/dL (ref 1.7–2.4)

## 2019-09-09 MED ORDER — METOPROLOL TARTRATE 5 MG/5ML IV SOLN
2.5000 mg | Freq: Once | INTRAVENOUS | Status: AC
  Administered 2019-09-09: 04:00:00 2.5 mg via INTRAVENOUS
  Filled 2019-09-09: qty 5

## 2019-09-09 MED ORDER — SODIUM CHLORIDE 0.9 % IV BOLUS
1000.0000 mL | Freq: Once | INTRAVENOUS | Status: AC
  Administered 2019-09-09: 04:00:00 1000 mL via INTRAVENOUS

## 2019-09-09 MED ORDER — POTASSIUM CHLORIDE CRYS ER 20 MEQ PO TBCR
40.0000 meq | EXTENDED_RELEASE_TABLET | Freq: Once | ORAL | Status: AC
  Administered 2019-09-09: 04:00:00 40 meq via ORAL
  Filled 2019-09-09: qty 2

## 2019-09-09 MED ORDER — LORAZEPAM 2 MG/ML IJ SOLN
0.5000 mg | Freq: Once | INTRAMUSCULAR | Status: AC
  Administered 2019-09-09: 04:00:00 0.5 mg via INTRAVENOUS
  Filled 2019-09-09: qty 1

## 2019-09-09 NOTE — Discharge Instructions (Signed)
Drink plenty of fluids.  Do not consume marijuana edibles! You never know how much of the active ingredient is in them.  Return if you are having any problems.

## 2019-09-09 NOTE — ED Notes (Signed)
Patient husband at bedside.

## 2021-06-04 IMAGING — CT CT HEAD W/O CM
3 series · 15 of 47 positions shown, 18 images · non-contrast
Comparison: None.

CLINICAL DATA: Altered mental status. Numbness and extremity.

EXAM:
CT HEAD WITHOUT CONTRAST
TECHNIQUE: Contiguous axial images were obtained from the base of the skull
through the vertex without intravenous contrast.

[Series 2: head wo · axial · 0.43mm/px · z∈[-187,-57]mm · 9 of 32 slices shown, 12 images]
[im 3/32  brain]
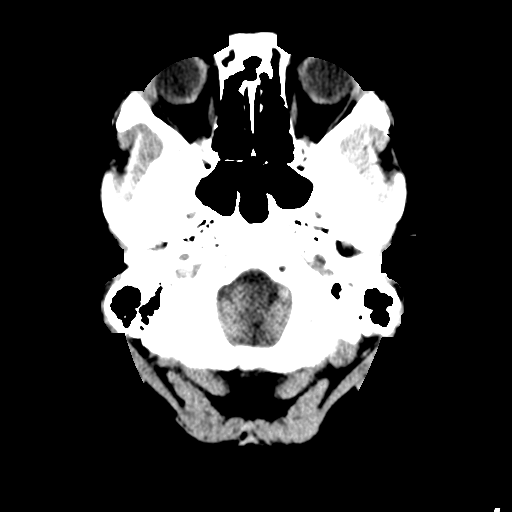
[im 3/32  bone]
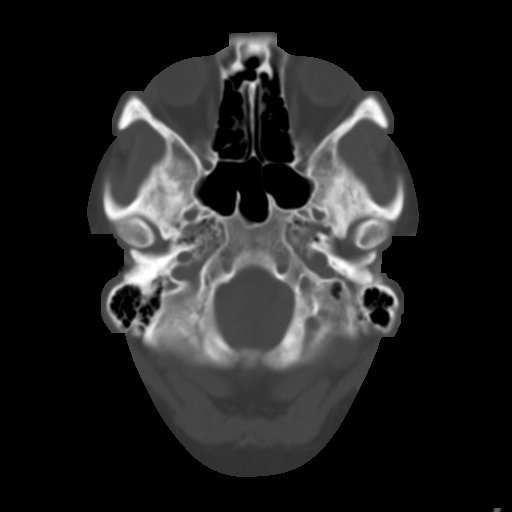
[im 6/32  brain]
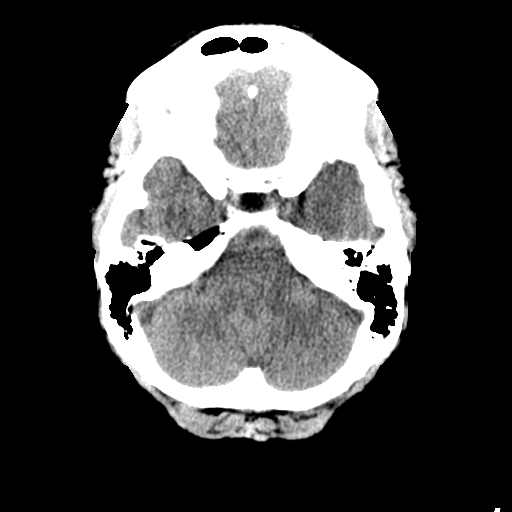
[im 9/32  brain]
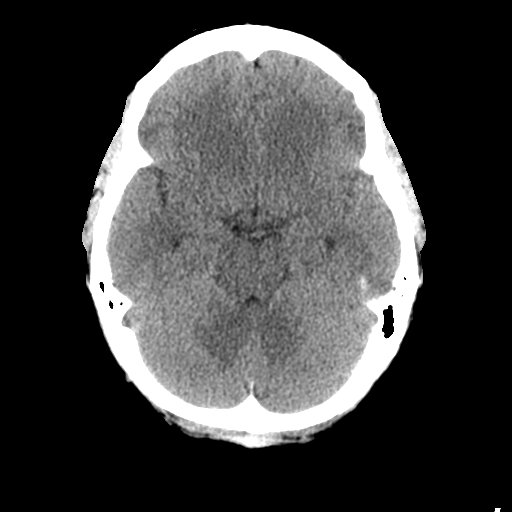
[im 12/32  brain]
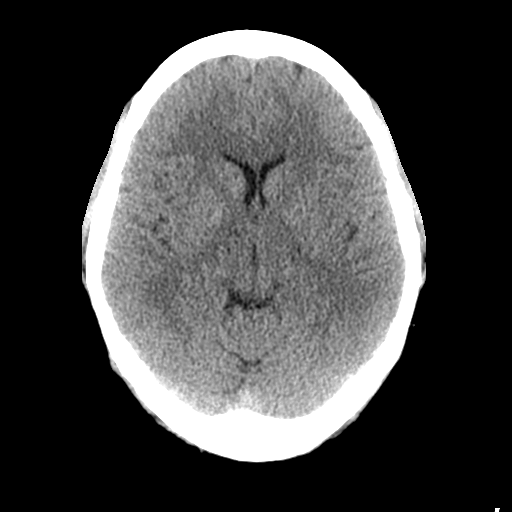
[im 17/32  brain]
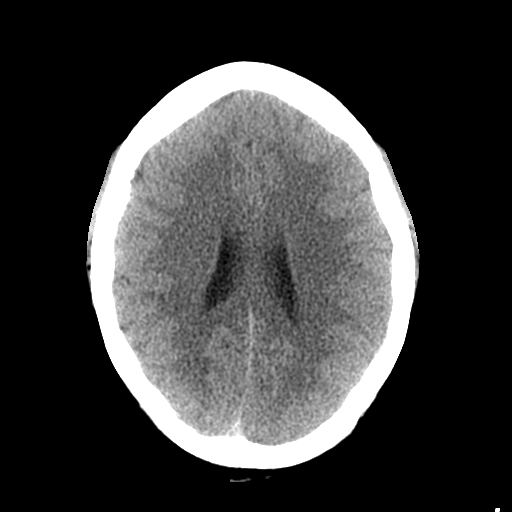
[im 17/32  bone]
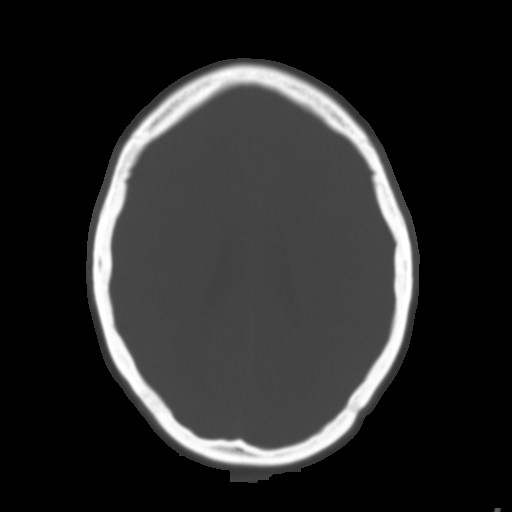
[im 20/32  brain]
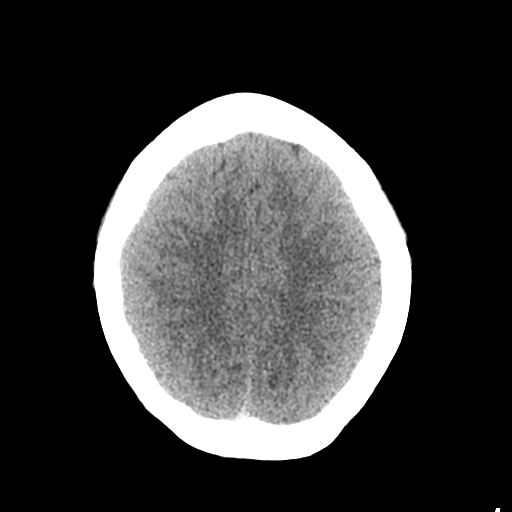
[im 23/32  brain]
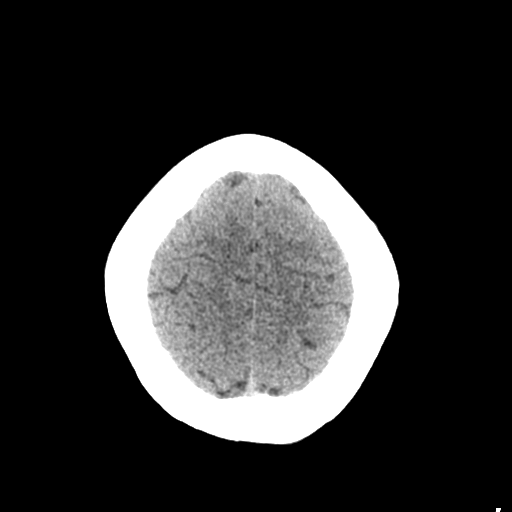
[im 26/32  brain]
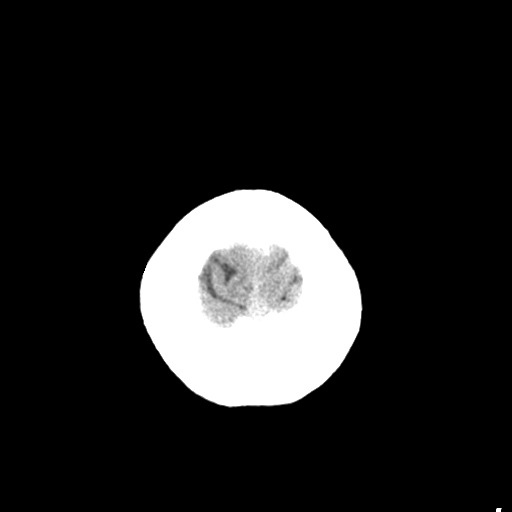
[im 29/32  brain]
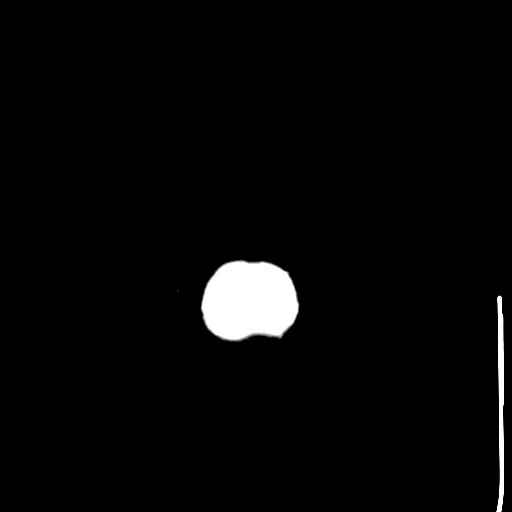
[im 29/32  bone]
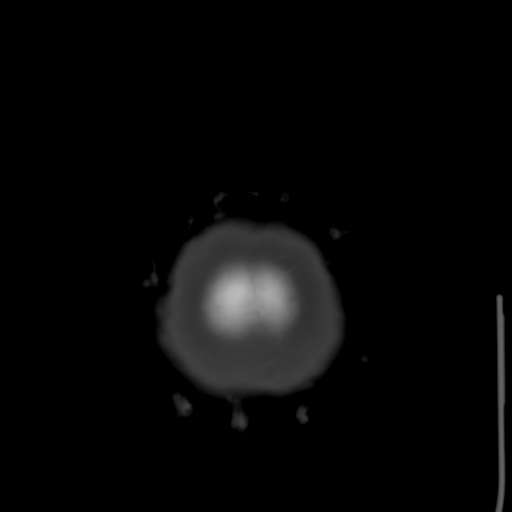

[Series 5: coronal soft tissue · coronal · 0.31mm/px · 3 of 75 slices shown]
[im 25/75  brain]
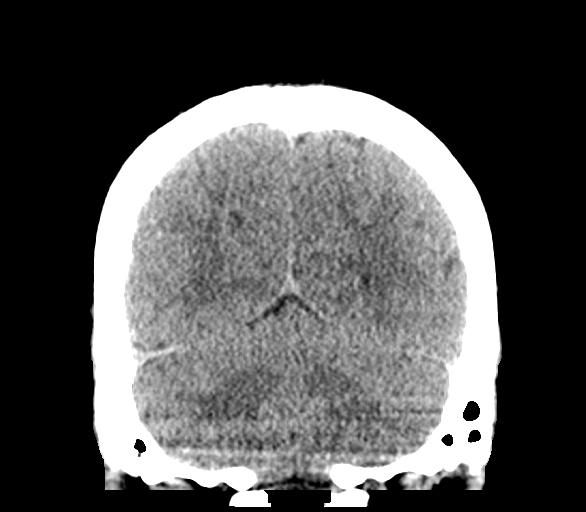
[im 33/75  brain]
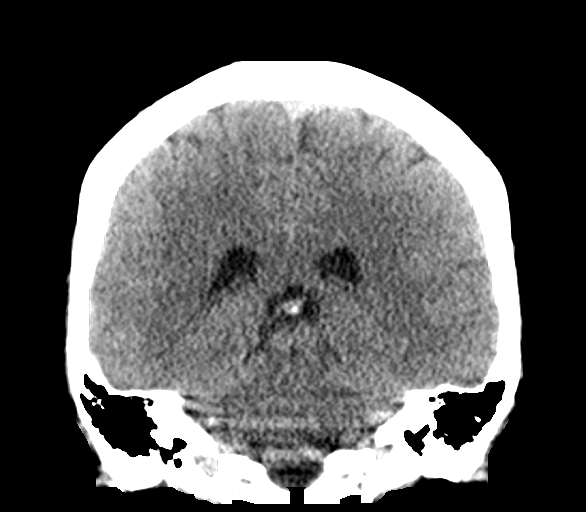
[im 42/75  brain]
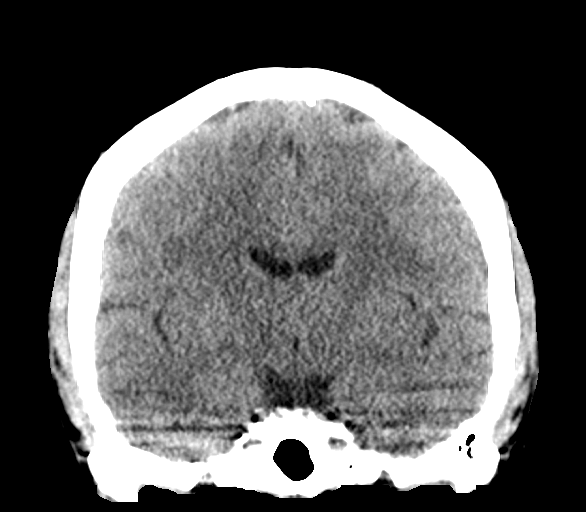

[Series 6: sagittal soft tissue · sagittal · 0.31mm/px · 3 of 57 slices shown]
[im 19/57  brain]
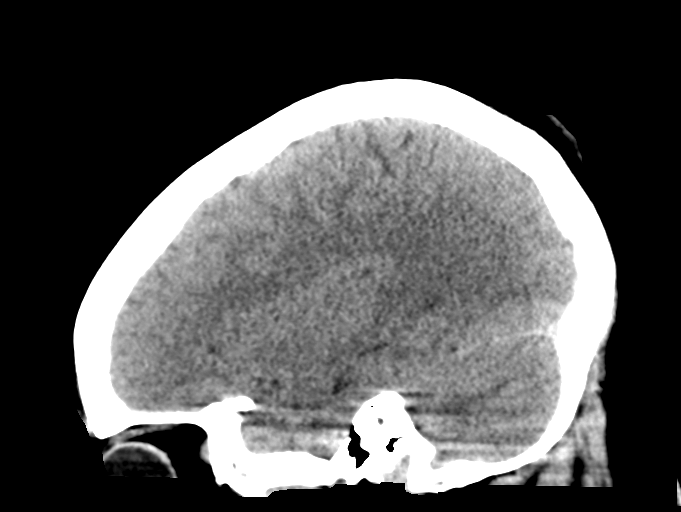
[im 29/57  brain]
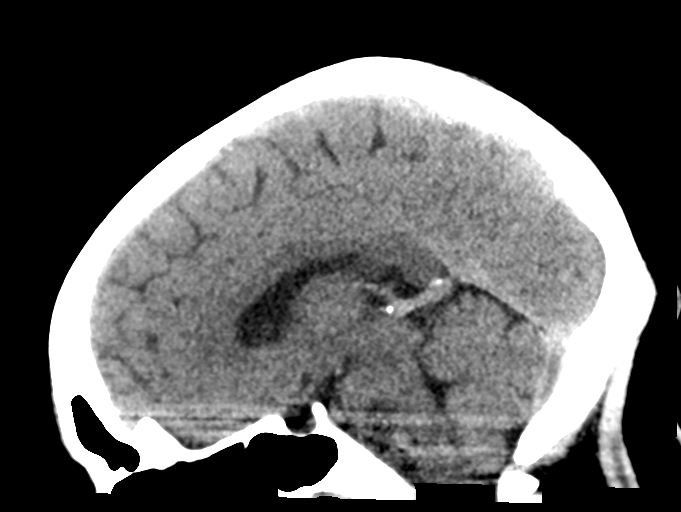
[im 38/57  brain]
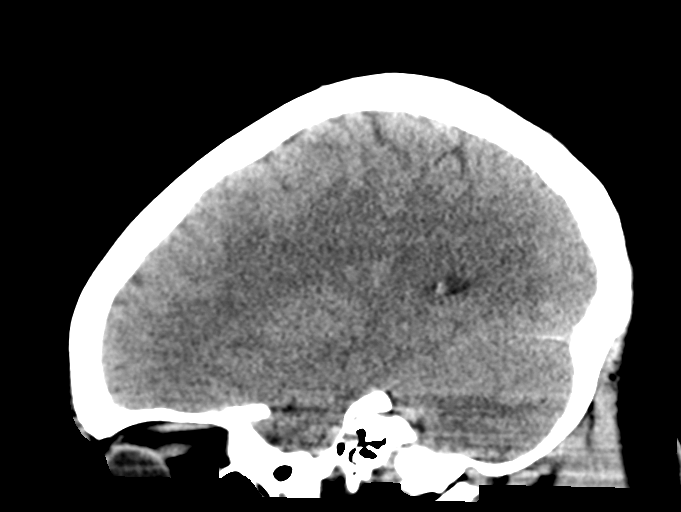

[15 of 47 positions shown; findings below may reference images not displayed]

FINDINGS: Brain: No evidence of acute infarction, hemorrhage, hydrocephalus,
extra-axial collection or mass lesion/mass effect.

Vascular: No hyperdense vessel or unexpected calcification.

Skull: Normal. Negative for fracture or focal lesion.

Sinuses/Orbits: Paranasal sinuses and mastoid air cells are clear.
The visualized orbits are unremarkable.

Other: None.
IMPRESSION: Negative noncontrast head CT.

## 2022-03-24 ENCOUNTER — Telehealth: Payer: Self-pay | Admitting: Genetic Counselor

## 2022-03-24 NOTE — Telephone Encounter (Signed)
Scheduled appt per 4/11 referral. Pt is aware of appt date and time. Pt is aware to arrive 15 mins prior to appt time and to bring and updated insurance card. Pt is aware of appt location.   ?

## 2022-04-30 ENCOUNTER — Other Ambulatory Visit: Payer: Self-pay

## 2022-04-30 ENCOUNTER — Inpatient Hospital Stay: Payer: BC Managed Care – PPO | Attending: Internal Medicine | Admitting: Genetic Counselor

## 2022-04-30 ENCOUNTER — Inpatient Hospital Stay: Payer: BC Managed Care – PPO

## 2022-04-30 DIAGNOSIS — Z803 Family history of malignant neoplasm of breast: Secondary | ICD-10-CM

## 2022-04-30 NOTE — Progress Notes (Signed)
REFERRING PROVIDER: Charyl Bigger, MD [address] [place],  [place] [address]  PRIMARY PROVIDER:  Vicenta Aly, FNP  PRIMARY REASON FOR VISIT:  Encounter Diagnosis  Name Primary?   Family history of breast cancer      HISTORY OF PRESENT ILLNESS:   Frances Murray, a 36 y.o. female, was seen for a [place] cancer genetics consultation at the request of Dr. Murrell Redden due to a family history of cancer.  Ms. Solorzano presents to clinic today to discuss the possibility of a hereditary predisposition to cancer, to discuss genetic testing, and to further clarify her future cancer risks, as well as potential cancer risks for family members.   Ms. Forgione is a 36 y.o. female with no personal history of cancer.    CANCER HISTORY:  Oncology History   No history exists.     RISK FACTORS:  Mammogram within the last year: yes Colonoscopy: no; not examined. Hysterectomy: no.  Ovaries intact: yes.  Up to date with pelvic exams: yes. Menarche was at age 24.  First live birth at age 59.  Menopausal status: premenopausal.  OCP use for approximately 6 years.  HRT use: 0 years. Dermatology: no Any excessive radiation exposure in the past: no  FAMILY HISTORY:  We obtained a detailed, 4-generation family history.  Significant diagnoses are listed below: Family History  Problem Relation Age of Onset   Breast cancer Mother 105   Thyroid cancer Mother 74   Colon cancer Paternal Grandfather        dx after 6     Ms. Hinderliter is unaware of previous family history of genetic testing for hereditary cancer risks. There is no reported Ashkenazi Jewish ancestry. There is no known consanguinity.  GENETIC COUNSELING ASSESSMENT: Ms. Tangney is a 36 y.o. female with a family history of cancer which is somewhat suggestive of a hereditary cancer syndrome and predisposition to cancer given her mother's age of breast cancer diagnosis. We, therefore, discussed and recommended the following at  today's visit.   DISCUSSION: We discussed that 5 - 10% of cancer is hereditary, with most cases of hereditary breast cancer associated with mutations in BRCA1/2.  There are other genes that can be associated with hereditary breast cancer syndromes.  We discussed that testing is beneficial for several reasons, including knowing about other cancer risks, identifying potential screening and risk-reduction options that may be appropriate, and to understanding if other family members could be at risk for cancer and allowing them to undergo genetic testing.  We reviewed the characteristics, features and inheritance patterns of hereditary cancer syndromes. We also discussed genetic testing, including the appropriate family members to test, the process of testing, insurance coverage and turn-around-time for results. We discussed the implications of a negative, positive, carrier and/or variant of uncertain significant result. We discussed that negative results would be uninformative given that Ms. Charters does not have a personal history of cancer. We recommended Ms. Trias pursue genetic testing for a panel that contains genes associated with breast cancer genes.  We discussed that some people do not want to undergo genetic testing due to fear of genetic discrimination.  A federal law called the Genetic Information Non-Discrimination Act (GINA) of 2008 helps protect individuals against genetic discrimination based on their genetic test results.  It impacts both health insurance and employment.  With health insurance, it protects against increased premiums, being kicked off insurance or being forced to take a test in order to be insured.  For employment it protects  against hiring, firing and promoting decisions based on genetic test results.  GINA does not apply to those in the [employer], those who work for companies with less than 15 employees, and new life insurance or long-term disability insurance policies.   Health status due to a cancer diagnosis is not protected under GINA.  Based on family history based risk model, Ms. Traister's lifetime chance for breast cancer is elevated but not considered high risk at this time based on available information.  Her lifetime risk for breast cancer based on Tyrer-Cuzick risk model is approximately 18%.  Of note, information about breast density was not available at time of calculation.  This risk estimate can change over time and may be repeated to reflect new information in her personal or family history in the future.  We recommend that she continue breast cancer screening as recommended by her providers.     PLAN:  Ms. Abdelrahman did not wish to pursue genetic testing at today's visit. She wanted more time to think through genetic testing and possible implications of a positive result.  We also recommended that her mother, who had a breast cancer history, have genetic testing/counseling.  Ms. Stryker will let us know if we can be of any assistance in coordinating genetic counseling and/or testing for this family member.   We remain available to coordinate genetic testing at any time in the future. We, therefore, recommend Ms. Perdomo continue to follow the cancer screening guidelines given by her primary healthcare provider.  Lastly, we encouraged Ms. Runquist to remain in contact with cancer genetics so that we can continuously update the family history and inform her of any changes in cancer genetics and testing that may be of benefit for this family.   Ms. Vanderhoff questions were answered to her satisfaction today. Our contact information was provided should additional questions or concerns arise. Thank you for the referral and allowing [place] to share in the care of your patient.   Abdiel Blackerby M. Joette Catching, [place], [facility] Genetic Counselor [email or web address] (P) [phone]   The patient was seen for a total of 30 minutes in face-to-face genetic counseling.  The  patient was seen alone.  Drs. Lindi Adie and/or Burr Medico were available to discuss this case as needed.  _______________________________________________________________________ For Office Staff:  Number of people involved in session: 2 Was an Intern/ student involved with case: no

## 2022-05-01 ENCOUNTER — Encounter: Payer: Self-pay | Admitting: Genetic Counselor

## 2022-05-01 DIAGNOSIS — Z803 Family history of malignant neoplasm of breast: Secondary | ICD-10-CM | POA: Insufficient documentation

## 2022-05-01 HISTORY — DX: Family history of malignant neoplasm of breast: Z80.3

## 2023-04-29 ENCOUNTER — Encounter (HOSPITAL_COMMUNITY): Payer: Self-pay

## 2023-04-29 ENCOUNTER — Emergency Department (HOSPITAL_COMMUNITY)
Admission: EM | Admit: 2023-04-29 | Discharge: 2023-04-29 | Disposition: A | Discharge status: Home / Self Care | Payer: BC Managed Care – PPO | Attending: Student | Admitting: Student

## 2023-04-29 ENCOUNTER — Other Ambulatory Visit: Payer: Self-pay

## 2023-04-29 ENCOUNTER — Emergency Department (HOSPITAL_COMMUNITY): Payer: BC Managed Care – PPO

## 2023-04-29 DIAGNOSIS — D259 Leiomyoma of uterus, unspecified: Secondary | ICD-10-CM | POA: Diagnosis not present

## 2023-04-29 DIAGNOSIS — D649 Anemia, unspecified: Secondary | ICD-10-CM | POA: Diagnosis not present

## 2023-04-29 DIAGNOSIS — N939 Abnormal uterine and vaginal bleeding, unspecified: Secondary | ICD-10-CM

## 2023-04-29 LAB — CBC WITH DIFFERENTIAL/PLATELET
Abs Immature Granulocytes: 0.02 10*3/uL (ref 0.00–0.07)
Basophils Absolute: 0 10*3/uL (ref 0.0–0.1)
Basophils Relative: 1 %
Eosinophils Absolute: 0.2 10*3/uL (ref 0.0–0.5)
Eosinophils Relative: 3 %
HCT: 35.7 % — ABNORMAL LOW (ref 36.0–46.0)
Hemoglobin: 11.3 g/dL — ABNORMAL LOW (ref 12.0–15.0)
Immature Granulocytes: 0 %
Lymphocytes Relative: 39 %
Lymphs Abs: 2.5 10*3/uL (ref 0.7–4.0)
MCH: 28.3 pg (ref 26.0–34.0)
MCHC: 31.7 g/dL (ref 30.0–36.0)
MCV: 89.5 fL (ref 80.0–100.0)
Monocytes Absolute: 0.4 10*3/uL (ref 0.1–1.0)
Monocytes Relative: 6 %
Neutro Abs: 3.3 10*3/uL (ref 1.7–7.7)
Neutrophils Relative %: 51 %
Platelets: 236 10*3/uL (ref 150–400)
RBC: 3.99 MIL/uL (ref 3.87–5.11)
RDW: 13.8 % (ref 11.5–15.5)
WBC: 6.3 10*3/uL (ref 4.0–10.5)
nRBC: 0 % (ref 0.0–0.2)

## 2023-04-29 LAB — BASIC METABOLIC PANEL
Anion gap: 8 (ref 5–15)
BUN: 11 mg/dL (ref 6–20)
CO2: 23 mmol/L (ref 22–32)
Calcium: 8.7 mg/dL — ABNORMAL LOW (ref 8.9–10.3)
Chloride: 107 mmol/L (ref 98–111)
Creatinine, Ser: 0.89 mg/dL (ref 0.44–1.00)
GFR, Estimated: >60 mL/min (ref >60)
Glucose, Bld: 102 mg/dL — ABNORMAL HIGH (ref 70–99)
Potassium: 3.5 mmol/L (ref 3.5–5.1)
Sodium: 138 mmol/L (ref 135–145)

## 2023-04-29 LAB — I-STAT BETA HCG BLOOD, ED (MC, WL, AP ONLY): I-stat hCG, quantitative: <5 m[IU]/mL (ref <5)

## 2023-04-29 MED ORDER — MEGESTROL ACETATE 40 MG PO TABS
40.0000 mg | ORAL_TABLET | Freq: Once | ORAL | Status: AC
  Administered 2023-04-29: 40 mg via ORAL
  Filled 2023-04-29: qty 1

## 2023-04-29 MED ORDER — MEGESTROL ACETATE 40 MG PO TABS
40.0000 mg | ORAL_TABLET | Freq: Every day | ORAL | Status: DC
  Filled 2023-04-29: qty 1

## 2023-04-29 MED ORDER — IBUPROFEN 800 MG PO TABS
800.0000 mg | ORAL_TABLET | Freq: Once | ORAL | Status: AC
  Administered 2023-04-29: 800 mg via ORAL
  Filled 2023-04-29: qty 1

## 2023-04-29 MED ORDER — MEGESTROL ACETATE 40 MG PO TABS: 40.0000 mg | ORAL_TABLET | Freq: Every day | ORAL | 0 refills | Status: AC

## 2023-04-29 NOTE — Discharge Instructions (Addendum)
Your workup today revealed that you have a fibroid in your uterus.  This is likely contributing to your vaginal bleeding.  Please take the Megace as directed.  If needed, your OB/GYN can increase the dose.  Please call them today and inform them about your ER visit and your vaginal bleeding and schedule a very close follow-up appointment.  Please return to the ER if you experience worsening dizziness, feeling like you will pass out/passing out, or any other new or concerning symptoms.

## 2023-04-29 NOTE — ED Notes (Signed)
Pt return from [place], NAD noted, A&O x4

## 2023-04-29 NOTE — ED Triage Notes (Signed)
Pt arrives with c/o vaginal bleeding that started tonight. Pt had IUD removed on Monday. Pt endorses passing multiple clots. Per pt, she has gone through 3-4 pads and a tampon. Pt denies SOB and dizziness. Pt endorses ABD cramping.

## 2023-04-29 NOTE — ED Notes (Signed)
Pt to US at this time.

## 2023-04-29 NOTE — ED Provider Notes (Signed)
[facility] EMERG[facility]Y DEPARTMENT AT [facility] Provider Note   CSN: [id number] Arrival date & time: 04/29/23  0043     History  Chief Complaint  Patient presents with   Vaginal Bleeding    Frances Murray is a 37 y.o. female.  HPI 37 year old female with a history of obesity, anxiety, hyperlipidemia to the ER with concerns for vaginal bleeding after an IUD removal.  He had her IUD removed on Monday.  She states that it needed to be ultrasound-guided as the IUD had migrated upwards.  After the removal, she had some bleeding but over the last several days her bleeding has increased and she is passing multiple large clots and changing about 10 pads daily.  She denies any dizziness or shortness of breath.  She does endorse some abdominal cramping.  She states that she was having slightly heavier periods over the last several months which she attributed to her IUD but she has never bled this heavily.    Home Medications Prior to Admission medications   Medication Sig Start Date End Date Taking? Authorizing Provider  megestrol (MEGACE) 40 MG tablet Take 1 tablet (40 mg total) by mouth daily. 04/29/23  Yes Mare Ferrari, PA-C  atorvastatin (LIPITOR) 10 MG tablet Take 1 tablet by mouth daily. Only takes Mon, Wed, Fri 08/30/19   [provider]  omeprazole (PRILOSEC) 40 MG capsule Take 1 capsule by mouth daily.    [provider]  Phentermine-Topiramate (QSYMIA) 3.75-23 MG CP24 Take 1 tablet by mouth daily. 07/28/19   [provider]      Allergies    Other    Review of Systems   Review of Systems Ten systems reviewed and are negative for acute change, except as noted in the HPI.   Physical Exam Updated Vital Signs BP (!) 137/92   Pulse 73   Temp 98.4 F (36.9 C) (Oral)   Resp 20   Wt 104.8 kg   SpO2 98%   BMI 38.44 kg/m  Physical Exam Vitals and nursing note reviewed.  Constitutional:      General: She is not in acute distress.     Appearance: She is well-developed.  HENT:     Head: Normocephalic and atraumatic.  Eyes:     Conjunctiva/sclera: Conjunctivae normal.  Cardiovascular:     Rate and Rhythm: Normal rate and regular rhythm.     Heart sounds: No murmur heard. Pulmonary:     Effort: Pulmonary effort is normal. No respiratory distress.     Breath sounds: Normal breath sounds.  Abdominal:     Palpations: Abdomen is soft.     Tenderness: There is no abdominal tenderness.  Genitourinary:    Comments: Attempted to perform pelvic exam however unable to visualize cervix due to large clot burden and bleeding Musculoskeletal:        General: No swelling.     Cervical back: Neck supple.  Skin:    General: Skin is warm and dry.     Capillary Refill: Capillary refill takes less than 2 seconds.  Neurological:     Mental Status: She is alert.  Psychiatric:        Mood and Affect: Mood normal.     ED Results / Procedures / Treatments   Labs (all labs ordered are listed, but only abnormal results are displayed) Labs Reviewed  CBC WITH DIFFERENTIAL/PLATELET - Abnormal; Notable for the following components:      Result Value   Hemoglobin 11.3 (*)  HCT 35.7 (*)    All other components within normal limits  BASIC METABOLIC PANEL - Abnormal; Notable for the following components:   Glucose, Bld 102 (*)    Calcium 8.7 (*)    All other components within normal limits  I-STAT BETA HCG BLOOD, ED ([facility], [facility], [facility] ONLY)    EKG None  Radiology US Pelvis Complete  Result Date: 04/29/2023 CLINICAL DATA:  Heavy vaginal bleeding following IUD removal EXAM: TRANSABDOMINAL ULTRASOUND OF PELVIS TECHNIQUE: Transabdominal ultrasound examination of the pelvis was performed including evaluation of the uterus, ovaries, adnexal regions, and pelvic cul-de-sac. COMPARISON:  12/15/2017 FINDINGS: Uterus Measurements: 12.6 x 6.3 x 7.2 cm = volume: 295 mL. 4.6 cm left posterior body fibroid. 2.3 cm anterior fundal fibroid. Endometrium  Thickness: 3 mm in thickness.  No focal abnormality visualized. Right ovary Measurements: 3.2 x 2.2 x 2.5 cm = volume: 9 mL. Normal appearance/no adnexal mass. Left ovary Measurements: 3.2 x 1.7 x 3.0 cm = volume: 9 mL. Normal appearance/no adnexal mass. Other findings:  No abnormal free fluid. IMPRESSION: Enlarged fibroid uterus. Endometrium within normal limits. Electronically Signed   By: Charlett Nose M.D.   On: 04/29/2023 03:30    Procedures Procedures    Medications Ordered in ED Medications  megestrol (MEGACE) tablet 40 mg (has no administration in time range)  ibuprofen (ADVIL) tablet 800 mg (has no administration in time range)    ED Course/ Medical Decision Making/ A&P                             Medical Decision Making Amount and/or Complexity of Data Reviewed Labs: ordered. Radiology: ordered.  Risk Prescription drug management.   37 year old female presenting with heavy vaginal bleeding after IUD removal.  On arrival, as her stable.  Abdomen is soft and minimally tender, attempted to perform pelvic exam however unable to visualize cervical os secondary to large clot burden and bleeding.  Labs ordered, reviewed CBC with a hemoglobin 11.3, 3 years ago was 13.2.  BMP unremarkable.  Pregnancy is negative.  Pelvic ultrasound performed, evidence of enlarged fibroid uterus, endometrium within normal limits.  I suspect this is contributing to her vaginal bleeding.  I spoke with the OB/GYN physician on-call Dr. Jolayne Panther, who recommended initiation of Megace and follow-up with her OB/GYN.  I discussed this with the patient and she is agreeable to this.  I did give her strict return precautions that if she continues to have heavy bleeding, starts to feel dizzy, lightheaded, short of breath or any other new or concerning symptoms to return to the ER.  She was given strict instructions to call her OB/GYN today and schedule follow-up.  Will send her home on Megace.  Stable for  discharge. Final Clinical Impression(s) / ED Diagnoses Final diagnoses:  Vaginal bleeding  Uterine leiomyoma, unspecified location    Rx / DC Orders ED Discharge Orders          Ordered    megestrol (MEGACE) 40 MG tablet  Daily        04/29/23 0417              Mare Ferrari, PA-C 04/29/23 0418    Glendora Score, MD 04/29/23 1701

## 2025-01-18 ENCOUNTER — Emergency Department (HOSPITAL_COMMUNITY)

## 2025-01-18 ENCOUNTER — Emergency Department (HOSPITAL_COMMUNITY)
Admission: EM | Admit: 2025-01-18 | Discharge: 2025-01-18 | Disposition: A | Discharge status: Home / Self Care | Attending: Emergency Medicine | Admitting: Emergency Medicine

## 2025-01-18 ENCOUNTER — Other Ambulatory Visit: Payer: Self-pay

## 2025-01-18 DIAGNOSIS — R55 Syncope and collapse: Secondary | ICD-10-CM | POA: Insufficient documentation

## 2025-01-18 DIAGNOSIS — D649 Anemia, unspecified: Secondary | ICD-10-CM | POA: Insufficient documentation

## 2025-01-18 LAB — I-STAT CHEM 8, ED
BUN: 6 mg/dL (ref 6–20)
Calcium, Ion: 1.18 mmol/L (ref 1.15–1.40)
Chloride: 104 mmol/L (ref 98–111)
Creatinine, Ser: 0.9 mg/dL (ref 0.44–1.00)
Glucose, Bld: 84 mg/dL (ref 70–99)
HCT: 31 % — ABNORMAL LOW (ref 36.0–46.0)
Hemoglobin: 10.5 g/dL — ABNORMAL LOW (ref 12.0–15.0)
Potassium: 3.6 mmol/L (ref 3.5–5.1)
Sodium: 141 mmol/L (ref 135–145)
TCO2: 22 mmol/L (ref 22–32)

## 2025-01-18 LAB — COMPREHENSIVE METABOLIC PANEL WITH GFR
ALT: 8 U/L (ref 0–44)
AST: 17 U/L (ref 15–41)
Albumin: 4.2 g/dL (ref 3.5–5.0)
Alkaline Phosphatase: 83 U/L (ref 38–126)
Anion gap: 11 (ref 5–15)
BUN: 7 mg/dL (ref 6–20)
CO2: 24 mmol/L (ref 22–32)
Calcium: 9.1 mg/dL (ref 8.9–10.3)
Chloride: 104 mmol/L (ref 98–111)
Creatinine, Ser: 0.82 mg/dL (ref 0.44–1.00)
GFR, Estimated: >60 mL/min
Glucose, Bld: 82 mg/dL (ref 70–99)
Potassium: 3.6 mmol/L (ref 3.5–5.1)
Sodium: 139 mmol/L (ref 135–145)
Total Bilirubin: 0.5 mg/dL (ref 0.0–1.2)
Total Protein: 7.8 g/dL (ref 6.5–8.1)

## 2025-01-18 LAB — URINALYSIS, ROUTINE W REFLEX MICROSCOPIC
Bilirubin Urine: NEGATIVE
Glucose, UA: NEGATIVE mg/dL
Hgb urine dipstick: NEGATIVE
Ketones, ur: NEGATIVE mg/dL
Leukocytes,Ua: NEGATIVE
Nitrite: NEGATIVE
Protein, ur: NEGATIVE mg/dL
Specific Gravity, Urine: 1.021 (ref 1.005–1.030)
pH: 5 (ref 5.0–8.0)

## 2025-01-18 LAB — IRON AND TIBC
Iron: 14 ug/dL — ABNORMAL LOW (ref 28–170)
Saturation Ratios: 3 % — ABNORMAL LOW (ref 10.4–31.8)
TIBC: 469 ug/dL — ABNORMAL HIGH (ref 250–450)
UIBC: 455 ug/dL

## 2025-01-18 LAB — CBC WITH DIFFERENTIAL/PLATELET
Abs Immature Granulocytes: 0.01 10*3/uL (ref 0.00–0.07)
Basophils Absolute: 0 10*3/uL (ref 0.0–0.1)
Basophils Relative: 1 %
Eosinophils Absolute: 0.1 10*3/uL (ref 0.0–0.5)
Eosinophils Relative: 2 %
HCT: 30.1 % — ABNORMAL LOW (ref 36.0–46.0)
Hemoglobin: 8.6 g/dL — ABNORMAL LOW (ref 12.0–15.0)
Immature Granulocytes: 0 %
Lymphocytes Relative: 47 %
Lymphs Abs: 2.6 10*3/uL (ref 0.7–4.0)
MCH: 20.8 pg — ABNORMAL LOW (ref 26.0–34.0)
MCHC: 28.6 g/dL — ABNORMAL LOW (ref 30.0–36.0)
MCV: 72.9 fL — ABNORMAL LOW (ref 80.0–100.0)
Monocytes Absolute: 0.5 10*3/uL (ref 0.1–1.0)
Monocytes Relative: 9 %
Neutro Abs: 2.2 10*3/uL (ref 1.7–7.7)
Neutrophils Relative %: 41 %
Platelets: 371 10*3/uL (ref 150–400)
RBC: 4.13 MIL/uL (ref 3.87–5.11)
RDW: 18.1 % — ABNORMAL HIGH (ref 11.5–15.5)
WBC: 5.4 10*3/uL (ref 4.0–10.5)
nRBC: 0 % (ref 0.0–0.2)

## 2025-01-18 LAB — TROPONIN T, HIGH SENSITIVITY
Troponin T High Sensitivity: <6 ng/L (ref 0–19)
Troponin T High Sensitivity: <6 ng/L (ref 0–19)

## 2025-01-18 LAB — HCG, SERUM, QUALITATIVE: Preg, Serum: NEGATIVE

## 2025-01-18 NOTE — Discharge Instructions (Signed)
 You were seen in the ER today for concerns of an episode of loss of consciousness. Your labs showed worsening anemia which is likely due to heavy menstrual bleeding. I added on iron panel labs to assess what I assume to be iron deficiency anemia but these will be available several hours from now. Please follow up with your primary care provider regarding these labs. Stay hydrated and avoid quick movements such as standing from lying or sitting as this can worsen feelings of lightheadedness. If your symptoms persist or worsen, return to the ER.

## 2025-01-18 NOTE — ED Provider Notes (Signed)
 " [place] EMERGENCY DEPARTMENT AT [facility] Provider Note   CSN: [id number] Arrival date & time: 01/18/25  1653     Patient presents with: Syncopal Event   Frances Murray is a 39 y.o. female.  Patient with past history significant for uterine fibroids presents the emergency department today with concerns of syncope.  Reportedly was at home when she had a syncopal episode while she tried to get out of her shower.  States that she was feeling lightheaded when her boyfriend caught her and assisted her to the ground.  She states that she woke up a few minutes later leading on to him.  Her boyfriend ported that she had twitching but no other symptoms.  She has felt fatigued since then has a mild headache.  Was seen urgent care and sent here for evaluation.  HPI     Prior to Admission medications  Medication Sig Start Date End Date Taking? Authorizing Provider  atorvastatin (LIPITOR) 10 MG tablet Take 1 tablet by mouth daily. Only takes Mon, Wed, Fri 08/30/19   [provider]  megestrol  (MEGACE ) 40 MG tablet Take 1 tablet (40 mg total) by mouth daily. 04/29/23   Vonn Hadassah LABOR, PA-C  omeprazole (PRILOSEC) 40 MG capsule Take 1 capsule by mouth daily.    [provider]  Phentermine-Topiramate (QSYMIA) 3.75-23 MG CP24 Take 1 tablet by mouth daily. 07/28/19   [provider]    Allergies: Other    Review of Systems  Neurological:  Positive for syncope.  All other systems reviewed and are negative.   Updated Vital Signs BP (!) 127/99   Pulse 64   Temp 98.7 F (37.1 C) (Oral)   Resp 18   Ht 5' 5 (1.651 m)   Wt 101.6 kg   SpO2 98%   BMI 37.28 kg/m   Physical Exam Vitals and nursing note reviewed.  Constitutional:      General: She is not in acute distress.    Appearance: She is well-developed.  HENT:     Head: Normocephalic and atraumatic.  Eyes:     Conjunctiva/sclera: Conjunctivae normal.  Cardiovascular:     Rate and Rhythm:  Normal rate and regular rhythm.     Heart sounds: No murmur heard. Pulmonary:     Effort: Pulmonary effort is normal. No respiratory distress.     Breath sounds: Normal breath sounds. No wheezing, rhonchi or rales.  Abdominal:     General: Abdomen is flat. Bowel sounds are normal. There is no distension.     Palpations: Abdomen is soft.     Tenderness: There is no abdominal tenderness. There is no guarding.  Musculoskeletal:        General: No swelling.     Cervical back: Neck supple.  Skin:    General: Skin is warm and dry.     Capillary Refill: Capillary refill takes less than 2 seconds.  Neurological:     General: No focal deficit present.     Mental Status: She is alert and oriented to person, place, and time. Mental status is at baseline.     Motor: No weakness.  Psychiatric:        Mood and Affect: Mood normal.        Thought Content: Thought content normal.        Judgment: Judgment normal.     (all labs ordered are listed, but only abnormal results are displayed) Labs Reviewed  CBC WITH DIFFERENTIAL/PLATELET - Abnormal; Notable for  the following components:      Result Value   Hemoglobin 8.6 (*)    HCT 30.1 (*)    MCV 72.9 (*)    MCH 20.8 (*)    MCHC 28.6 (*)    RDW 18.1 (*)    All other components within normal limits  URINALYSIS, ROUTINE W REFLEX MICROSCOPIC - Abnormal; Notable for the following components:   APPearance HAZY (*)    All other components within normal limits  I-STAT CHEM 8, ED - Abnormal; Notable for the following components:   Hemoglobin 10.5 (*)    HCT 31.0 (*)    All other components within normal limits  COMPREHENSIVE METABOLIC PANEL WITH GFR  HCG, SERUM, QUALITATIVE  IRON AND TIBC  TROPONIN T, HIGH SENSITIVITY  TROPONIN T, HIGH SENSITIVITY    EKG: EKG Interpretation Date/Time:  Thursday January 18 2025 20:51:30 EST Ventricular Rate:  64 PR Interval:  176 QRS Duration:  62 QT Interval:  436 QTC Calculation: 449 R  Axis:   58  Text Interpretation: Normal sinus rhythm Normal ECG no wpw, prolonged qt or brugada Otherwise no significant change Confirmed by Emil Share [phone]) on 01/18/2025 9:07:54 PM  Radiology: CT HEAD WO CONTRAST Result Date: 01/18/2025 EXAM: CT HEAD WITHOUT CONTRAST 01/18/2025 07:12:26 PM TECHNIQUE: CT of the head was performed without the administration of intravenous contrast. Automated exposure control, iterative reconstruction, and/or weight based adjustment of the mA/kV was utilized to reduce the radiation dose to as low as reasonably achievable. COMPARISON: 09/09/2019 CLINICAL HISTORY: Syncope/presyncope, cerebrovascular cause suspected. FINDINGS: BRAIN AND VENTRICLES: No acute hemorrhage. No evidence of acute infarct. No hydrocephalus. No extra-axial collection. No mass effect or midline shift. SINUSES: No acute abnormality. SOFT TISSUES AND SKULL: No acute soft tissue abnormality. No skull fracture. IMPRESSION: 1. No acute intracranial abnormality. Electronically signed by: Pinkie Pebbles MD 01/18/2025 07:13 PM EST RP Workstation: [id number]     Procedures   Medications Ordered in the ED - No data to display                                  Medical Decision Making Amount and/or Complexity of Data Reviewed Labs: ordered.   This patient presents to the ED for concern of syncope.  Differential diagnosis includes vasovagal syncope, ACS, PE, dehydration, anemia    Additional history obtained:  Additional history obtained from chart review   Lab Tests:  I Ordered, and personally interpreted labs.  The pertinent results include: CBC shows hemoglobin downtrending to 8.6.,  CMP unremarkable, troponin negative x 2, hCG negative, UA without signs of infection.   Imaging Studies ordered:  I ordered imaging studies including CT head I independently visualized and interpreted imaging which showed no acute processes I agree with the radiologist interpretation   Problem List /  ED Course:  Patient with past history significant for uterine fibroids presents to the emergency department today with concerns of syncopal event.  Reportedly had syncope while at home try to get out of the shower.  At that time, she began to feel lightheaded and was assisted to the ground by her partner.  She has not noted any symptoms last few days but has noted that she was on her period recently with heavy bleeding. On exam, there is no focal abdominal tenderness, wheezing, rales, rhonchi, or cardiac murmurs.  She is otherwise well-appearing but does report a mild headache. Workup shows downtrending hemoglobin at 8.6.  Most recent compare to is 11.3.  This was 1 year ago.  Labs otherwise unremarkable.  No other signs of infection.  CT head negative. Given high suspicion for anemia secondary to uterine bleeding, advised patient should likely benefit from gynecology follow-up.  Patient has no abdominal pain at this time, no indication for imaging.  Advised that patient would likely benefit from anemia screening with iron panel.  Added abdominal and and advised that she can follow-up with her PCP for further evaluation regarding these findings.  She is otherwise stable for outpatient follow-up and discharged home.   Social Determinants of Health:  None  Final diagnoses:  Syncope, unspecified syncope type  Anemia, unspecified type    ED Discharge Orders     None          Cecily Legrand LABOR, PA-C 01/18/25 2249    Emil Share, DO 01/18/25 2312  "

## 2025-01-18 NOTE — ED Triage Notes (Signed)
 Pt came in via POV d/t a syncopal event that happened as soon as she walked out of her shower. She remembers feeling lightheaded & her boyfriend assisted her to the toilet & the she remembers waking up leaning forward onto him. Her boyfriend states she twitched a bit, no loss of bladder function, no oral trauma. Pt then felt fatigued since until now. Did begin to have a HA 2 hrs pta, went to UC & then she was sent her for eval.

## 2025-01-18 NOTE — ED Provider Triage Note (Signed)
 Emergency Medicine Provider Triage Evaluation Note  Frances Murray , a 39 y.o. female  was evaluated in triage.  Pt complains of syncope yesterday while getting out of the shower. Felt like she was going to pass out, was assisted to sit on the commode by significant other who reports brief LOC and body twitch. Did not hit head, no injuries, not on thinners.  No history of seizures, no loss of bladder control, did not bite tongue. Had dull headache yesterday. Today with intermittent dull headache with fatigue and feeling generally unwell.  No fevers, no cardiac meds.  Review of Systems  Positive:  Negative:   Physical Exam  BP (!) 130/94   Pulse 66   Temp 98.7 F (37.1 C) (Oral)   Resp 18   Ht 5' 5 (1.651 m)   Wt 101.6 kg   SpO2 100%   BMI 37.28 kg/m  Gen:   Awake, no distress   Resp:  Normal effort  MSK:   Moves extremities without difficulty  Other:    Medical Decision Making  Medically screening exam initiated at 6:21 PM.  Appropriate orders placed.  Frances Murray was informed that the remainder of the evaluation will be completed by another provider, this initial triage assessment does not replace that evaluation, and the importance of remaining in the ED until their evaluation is complete.     Frances Leita LABOR, PA-C 01/18/25 1824
